# Patient Record
Sex: Female | Born: 1960
Health system: Southern US, Community
[De-identification: ages and names within clinical notes are randomized; demographics above are authoritative.]

## PROBLEM LIST (undated history)

## (undated) DIAGNOSIS — G8929 Other chronic pain: Secondary | ICD-10-CM

## (undated) DIAGNOSIS — M1990 Unspecified osteoarthritis, unspecified site: Secondary | ICD-10-CM

## (undated) DIAGNOSIS — J449 Chronic obstructive pulmonary disease, unspecified: Secondary | ICD-10-CM

## (undated) DIAGNOSIS — G629 Polyneuropathy, unspecified: Secondary | ICD-10-CM

## (undated) HISTORY — PX: ANKLE SURGERY: SHX546

## (undated) HISTORY — PX: TONSILLECTOMY: SUR1361

---

## 2007-11-30 ENCOUNTER — Emergency Department (HOSPITAL_COMMUNITY): Admission: EM | Admit: 2007-11-30 | Discharge: 2007-11-30 | Payer: Self-pay | Admitting: Emergency Medicine

## 2008-02-15 ENCOUNTER — Emergency Department (HOSPITAL_COMMUNITY): Admission: EM | Admit: 2008-02-15 | Discharge: 2008-02-15 | Payer: Self-pay | Admitting: Emergency Medicine

## 2008-03-16 ENCOUNTER — Emergency Department (HOSPITAL_COMMUNITY): Admission: EM | Admit: 2008-03-16 | Discharge: 2008-03-16 | Payer: Self-pay | Admitting: Emergency Medicine

## 2008-03-26 ENCOUNTER — Emergency Department (HOSPITAL_COMMUNITY): Admission: EM | Admit: 2008-03-26 | Discharge: 2008-03-27 | Payer: Self-pay | Admitting: Emergency Medicine

## 2009-03-25 ENCOUNTER — Emergency Department (HOSPITAL_BASED_OUTPATIENT_CLINIC_OR_DEPARTMENT_OTHER): Admission: EM | Admit: 2009-03-25 | Discharge: 2009-03-25 | Payer: Self-pay | Admitting: Emergency Medicine

## 2009-03-25 ENCOUNTER — Ambulatory Visit: Payer: Self-pay | Admitting: Diagnostic Radiology

## 2009-04-23 ENCOUNTER — Emergency Department (HOSPITAL_BASED_OUTPATIENT_CLINIC_OR_DEPARTMENT_OTHER): Admission: EM | Admit: 2009-04-23 | Discharge: 2009-04-23 | Payer: Self-pay | Admitting: Emergency Medicine

## 2009-07-27 ENCOUNTER — Emergency Department (HOSPITAL_BASED_OUTPATIENT_CLINIC_OR_DEPARTMENT_OTHER): Admission: EM | Admit: 2009-07-27 | Discharge: 2009-07-27 | Payer: Self-pay | Admitting: Emergency Medicine

## 2009-11-16 ENCOUNTER — Emergency Department (HOSPITAL_BASED_OUTPATIENT_CLINIC_OR_DEPARTMENT_OTHER): Admission: EM | Admit: 2009-11-16 | Discharge: 2009-11-16 | Payer: Self-pay | Admitting: Emergency Medicine

## 2010-06-07 ENCOUNTER — Emergency Department (HOSPITAL_BASED_OUTPATIENT_CLINIC_OR_DEPARTMENT_OTHER): Admission: EM | Admit: 2010-06-07 | Discharge: 2010-06-07 | Payer: Self-pay | Admitting: Emergency Medicine

## 2011-01-29 LAB — GLUCOSE, CAPILLARY

## 2011-02-17 LAB — GLUCOSE, CAPILLARY: Glucose-Capillary: 157 mg/dL — ABNORMAL HIGH (ref 70–99)

## 2011-04-28 ENCOUNTER — Emergency Department (HOSPITAL_BASED_OUTPATIENT_CLINIC_OR_DEPARTMENT_OTHER)
Admission: EM | Admit: 2011-04-28 | Discharge: 2011-04-28 | Discharge status: Against Medical Advice | Payer: Self-pay | Attending: Emergency Medicine | Admitting: Emergency Medicine

## 2011-04-28 DIAGNOSIS — I2510 Atherosclerotic heart disease of native coronary artery without angina pectoris: Secondary | ICD-10-CM | POA: Insufficient documentation

## 2011-04-28 DIAGNOSIS — E1142 Type 2 diabetes mellitus with diabetic polyneuropathy: Secondary | ICD-10-CM | POA: Insufficient documentation

## 2011-04-28 DIAGNOSIS — Z79899 Other long term (current) drug therapy: Secondary | ICD-10-CM | POA: Insufficient documentation

## 2011-04-28 DIAGNOSIS — F17200 Nicotine dependence, unspecified, uncomplicated: Secondary | ICD-10-CM | POA: Insufficient documentation

## 2011-04-28 DIAGNOSIS — E1149 Type 2 diabetes mellitus with other diabetic neurological complication: Secondary | ICD-10-CM | POA: Insufficient documentation

## 2012-02-08 ENCOUNTER — Encounter (HOSPITAL_BASED_OUTPATIENT_CLINIC_OR_DEPARTMENT_OTHER): Payer: Self-pay | Admitting: *Deleted

## 2012-02-08 ENCOUNTER — Emergency Department (HOSPITAL_BASED_OUTPATIENT_CLINIC_OR_DEPARTMENT_OTHER)
Admission: EM | Admit: 2012-02-08 | Discharge: 2012-02-08 | Disposition: A | Discharge status: Home / Self Care | Payer: Self-pay | Attending: Emergency Medicine | Admitting: Emergency Medicine

## 2012-02-08 DIAGNOSIS — E1140 Type 2 diabetes mellitus with diabetic neuropathy, unspecified: Secondary | ICD-10-CM

## 2012-02-08 DIAGNOSIS — E1142 Type 2 diabetes mellitus with diabetic polyneuropathy: Secondary | ICD-10-CM | POA: Insufficient documentation

## 2012-02-08 DIAGNOSIS — Z794 Long term (current) use of insulin: Secondary | ICD-10-CM | POA: Insufficient documentation

## 2012-02-08 DIAGNOSIS — E1149 Type 2 diabetes mellitus with other diabetic neurological complication: Secondary | ICD-10-CM | POA: Insufficient documentation

## 2012-02-08 DIAGNOSIS — Z880 Allergy status to penicillin: Secondary | ICD-10-CM | POA: Insufficient documentation

## 2012-02-08 DIAGNOSIS — Z888 Allergy status to other drugs, medicaments and biological substances status: Secondary | ICD-10-CM | POA: Insufficient documentation

## 2012-02-08 DIAGNOSIS — F17200 Nicotine dependence, unspecified, uncomplicated: Secondary | ICD-10-CM | POA: Insufficient documentation

## 2012-02-08 HISTORY — DX: Other chronic pain: G89.29

## 2012-02-08 HISTORY — DX: Polyneuropathy, unspecified: G62.9

## 2012-02-08 MED ORDER — TRAMADOL HCL 50 MG PO TABS
50.0000 mg | ORAL_TABLET | Freq: Once | ORAL | Status: AC
  Administered 2012-02-08: 50 mg via ORAL
  Filled 2012-02-08 (×2): qty 1

## 2012-02-08 MED ORDER — TRAMADOL HCL 50 MG PO TABS: 50.0000 mg | ORAL_TABLET | Freq: Four times a day (QID) | ORAL | Status: AC | PRN

## 2012-02-08 NOTE — ED Provider Notes (Signed)
Medical screening examination/treatment/procedure(s) were performed by non-physician practitioner and as supervising physician I was immediately available for consultation/collaboration.   Rolan Bucco, MD 02/08/12 [id number]

## 2012-02-08 NOTE — ED Provider Notes (Signed)
History     CSN: [id number]  Arrival date & time 02/08/12  [place]   First MD Initiated Contact with Patient 02/08/12 1904      Chief Complaint  Patient presents with  . Leg Pain    (Consider location/radiation/quality/duration/timing/severity/associated sxs/prior treatment) HPI Comments: Pt states that she has a history of diabetic neuropathy:pt states that she was treated by Dr. Wende Bushy with Narcotics but she left and pt can't afford to be seen elsewhere:pt states that she is scheduled to see [facility] on April 17th:pt states that she takes neurotin but she needs pain medications on top of it:pt denies weakness:pt c/o burning tingling in bilateral feet  The history is provided by the patient. No language interpreter was used.    Past Medical History  Diagnosis Date  . Diabetes mellitus   . Chronic pain   . Neuropathy     Past Surgical History  Procedure Date  . Tonsillectomy   . Cesarean section   . Ankle surgery     No family history on file.  History  Substance Use Topics  . Smoking status: Current Everyday Smoker -- 0.5 packs/day  . Smokeless tobacco: Not on file  . Alcohol Use: No    OB History    Grav Para Term Preterm Abortions TAB SAB Ect Mult Living                  Review of Systems  Constitutional: Negative.   Respiratory: Negative.   Cardiovascular: Negative.   Musculoskeletal: Negative.   Neurological: Negative for weakness.    Allergies  Naproxen and Penicillins  Home Medications   Current Outpatient Rx  Name Route Sig Dispense Refill  . EZETIMIBE-SIMVASTATIN 10-80 MG PO TABS Oral Take 1 tablet by mouth at bedtime.    Marland Kitchen GABAPENTIN 800 MG PO TABS Oral Take 800 mg by mouth 4 (four) times daily.    . INSULIN DETEMIR 100 UNIT/ML [place] SOLN Subcutaneous Inject 30 Units into the skin every morning.    Marland Kitchen METFORMIN HCL 1000 MG PO TABS Oral Take 1,000 mg by mouth 2 (two) times daily with a meal.    . SERTRALINE HCL 100 MG PO TABS Oral Take 100 mg  by mouth daily.      BP 128/71  Pulse 103  Temp(Src) 98.9 F (37.2 C) (Oral)  Resp 20  SpO2 99%  Physical Exam  Nursing note and vitals reviewed. Constitutional: She is oriented to person, place, and time. She appears well-developed and well-nourished.  HENT:  Head: Normocephalic and atraumatic.  Cardiovascular: Normal rate and regular rhythm.   Pulmonary/Chest: Breath sounds normal.  Musculoskeletal: Normal range of motion.       Pulses intact  Neurological: She is alert and oriented to person, place, and time. She exhibits normal muscle tone. Coordination normal.  Skin: Skin is warm and dry.  Psychiatric: She has a normal mood and affect.    ED Course  Procedures (including critical care time)  Labs Reviewed - No data to display No results found.   1. Diabetic neuropathy       MDM  Discussed with pt that will treat with ultram:pt is upset with this:pt looked up in data base and pt was getting 120 hydrocodone10 per month from [facility]        Teressa Lower, NP 02/08/12 1935

## 2012-02-08 NOTE — ED Notes (Signed)
Pain in her legs. Hx of neuropathy. States she does not have a doctor to give her pain medication. Has an appointment April 17 th to see a doctor.

## 2012-02-08 NOTE — Discharge Instructions (Signed)
Diabetic Neuropathy Diabetic neuropathy is a common complication caused by diabetes. Neuropathy is a term that means nerve disease or damage. If your diabetes is uncontrolled and you have high blood glucose (sugar) levels, over time, this can lead to damage to nerves throughout your body. There are three types of diabetic neuropathy:   Peripheral.   Autonomic.   Focal.  PERIPHERAL NEUROPATHY Peripheral neuropathy is the most common form of diabetic neuropathy. It causes damage to the nerves of the feet and legs and eventually the hands and arms.  SYMPTOMS  Peripheral neuropathy occurs slowly over time. The peripheral nerves sense touch, hot and cold, and pain. When these nerves no longer work:   Your feet become numb.   You can no longer feel pressure or pain in your feet.   You may have burning, stabbing or aching pain.  This can lead to:  Thick calluses over pressure areas.   Pressure sores.   Ulcers. Ulcers can become infected with germs (bacteria) and can even lead to infection in the bones of the feet.  DIAGNOSIS  The diagnosis of diabetic neuropathy is difficult at best. Sensory function testing can be done with:  Light touch using a monofilament.   Vibration with tuning fork.   Sharp sensation with pin prick  Other tests that can help diagnose neuropathy are:  Nerve Conduction Velocities (NCV). This checks the transmission of electrical current through a nerve.   Electromyography (EMG). This shows how muscles respond to electrical signals transmitted by nearby nerves.   Quantitative sensory testing, which is used to assess how your nerves respond to vibration and changes in temperature.  AUTONOMIC NEUROPATHY The autonomic nervous system controls functions that you do not think about. Examples would be:   Heart beat.   Regulation of body temperature.   Blood pressure.   Urination.   Digestion.   Sweating.   Sexual function.  SYMPTOMS  The symptoms of  autonomic neuropathy vary depending on which nerves are affected.   There can be problems with digestion such as:   Feeling sick to your stomach (nausea).   Vomiting.   Bloating.   Constipation.   Diarrhea.   Abdominal pain.   Difficulty with urination may occur because of the inability to sense when your bladder is full. You may have urine leakage (incontinence) or inability to empty your bladder completely (retention).   Palpitations or a feeling of an abnormal heart beat.   Blood pressure drops on arising (orthostatic hypotension). This can happen when you first sit up or stand up. It causes you to feel:   Dizzy.   Weak.   Faint.   Sexual functioning:   In men, inability to attain and maintain an erection.   In women, vaginal dryness and problems with decreased sexual desire and arousal.  DIAGNOSIS  Diagnosis is often based on reported symptoms. Tell your medical caregiver if you experience:   Dizziness.   Constipation.   Diarrhea.   Inappropriate urination or inability to urinate.   Inability to get or maintain an erection.  Tests that may be done include:  An EKG or Holter Monitor. These are tests that can help show problems with the heart rate or heart rhythm.   X-rays can be used to find if there are problems with your ability to properly empty food from your stomach into the small intestine after eating.  FOCAL NEUROPATHY Focal neuropathy affects just one nerve tract and occurs suddenly. However, it usually improves by itself   over time. It does not cause long term damage, and treatments are usually needed only until the problem improves. SYMPTOMS  Examples include:   Abnormal eye movements or abnormal alignment of both eyes.   Weakness in the wrist.   Foot drop, which results in inability to lift the foot properly. This causes abnormal walking or foot movement.  DIAGNOSIS  Diagnosis is made based on your symptoms and what your caregiver finds on  your exam. Other tests that may be done include:  Nerve Conduction Velocities (NCV). This checks the transmission of electrical current through a nerve.   Electromyography (EMG). This shows how muscles respond to electrical signals transmitted by nearby nerves.   Quantitative sensory testing, which is used to assess how your nerves respond to vibration and changes in temperature.  TREATMENT Once nerve damage occurs it cannot be reversed. The goal of treatment is to keep the disease from getting worse. If it gets worse, it will affect more nerve fibers. Controlling your blood (sugar) is the key. You will need to keep your blood glucose and A1c at the target range prescribed by your caregiver. Things that will help control blood glucose levels include:  Blood glucose monitoring.   Meal planning.   Physical activity.   Diabetes medication.  Over time, maintaining lower blood glucose levels helps lessen symptoms. Sometimes, prescription pain medicine is needed. Focal neuropathy can be painful and unpredictable and occurs most often in older adults with diabetes.  SEEK MEDICAL CARE IF:   You develop peripheral nerve symptoms such as burning, numbness, or pain in your feet, legs or hands.   You develop autonomic nerve symptoms such as:   Dizziness.   Abnormal urinary control.   Inability to get an erection.   You develop focal nerve symptoms such as sudden abnormal eye movements or sudden foot drop.  Document Released: 01/08/2002 Document Revised: 10/19/2011 Document Reviewed: 04/09/2009 [employer] Patient Information 2012 [employer]. 

## 2014-11-03 ENCOUNTER — Ambulatory Visit: Payer: Medicare Other | Admitting: *Deleted

## 2014-11-27 DIAGNOSIS — M545 Low back pain: Secondary | ICD-10-CM | POA: Diagnosis not present

## 2014-12-04 DIAGNOSIS — M79604 Pain in right leg: Secondary | ICD-10-CM | POA: Diagnosis not present

## 2014-12-04 DIAGNOSIS — M542 Cervicalgia: Secondary | ICD-10-CM | POA: Diagnosis not present

## 2014-12-04 DIAGNOSIS — M79605 Pain in left leg: Secondary | ICD-10-CM | POA: Diagnosis not present

## 2014-12-04 DIAGNOSIS — G8929 Other chronic pain: Secondary | ICD-10-CM | POA: Diagnosis not present

## 2014-12-04 DIAGNOSIS — M79652 Pain in left thigh: Secondary | ICD-10-CM | POA: Diagnosis not present

## 2014-12-22 DIAGNOSIS — I10 Essential (primary) hypertension: Secondary | ICD-10-CM | POA: Diagnosis not present

## 2014-12-22 DIAGNOSIS — Z0000 Encounter for general adult medical examination without abnormal findings: Secondary | ICD-10-CM | POA: Diagnosis not present

## 2014-12-22 DIAGNOSIS — J40 Bronchitis, not specified as acute or chronic: Secondary | ICD-10-CM | POA: Diagnosis not present

## 2014-12-22 DIAGNOSIS — Z136 Encounter for screening for cardiovascular disorders: Secondary | ICD-10-CM | POA: Diagnosis not present

## 2014-12-22 DIAGNOSIS — E1140 Type 2 diabetes mellitus with diabetic neuropathy, unspecified: Secondary | ICD-10-CM | POA: Diagnosis not present

## 2014-12-22 DIAGNOSIS — Z1389 Encounter for screening for other disorder: Secondary | ICD-10-CM | POA: Diagnosis not present

## 2014-12-22 DIAGNOSIS — Z7689 Persons encountering health services in other specified circumstances: Secondary | ICD-10-CM | POA: Diagnosis not present

## 2015-01-07 DIAGNOSIS — M25551 Pain in right hip: Secondary | ICD-10-CM | POA: Diagnosis not present

## 2015-01-07 DIAGNOSIS — M545 Low back pain: Secondary | ICD-10-CM | POA: Diagnosis not present

## 2015-01-07 DIAGNOSIS — M25552 Pain in left hip: Secondary | ICD-10-CM | POA: Diagnosis not present

## 2015-01-07 DIAGNOSIS — F1120 Opioid dependence, uncomplicated: Secondary | ICD-10-CM | POA: Diagnosis not present

## 2015-01-07 DIAGNOSIS — R202 Paresthesia of skin: Secondary | ICD-10-CM | POA: Diagnosis not present

## 2015-02-01 DIAGNOSIS — Z1231 Encounter for screening mammogram for malignant neoplasm of breast: Secondary | ICD-10-CM | POA: Diagnosis not present

## 2015-02-11 DIAGNOSIS — F1120 Opioid dependence, uncomplicated: Secondary | ICD-10-CM | POA: Diagnosis not present

## 2015-02-11 DIAGNOSIS — G8929 Other chronic pain: Secondary | ICD-10-CM | POA: Diagnosis not present

## 2015-02-11 DIAGNOSIS — M542 Cervicalgia: Secondary | ICD-10-CM | POA: Diagnosis not present

## 2015-02-11 DIAGNOSIS — M545 Low back pain: Secondary | ICD-10-CM | POA: Diagnosis not present

## 2015-03-11 DIAGNOSIS — M25551 Pain in right hip: Secondary | ICD-10-CM | POA: Diagnosis not present

## 2015-03-11 DIAGNOSIS — F1120 Opioid dependence, uncomplicated: Secondary | ICD-10-CM | POA: Diagnosis not present

## 2015-03-11 DIAGNOSIS — M25552 Pain in left hip: Secondary | ICD-10-CM | POA: Diagnosis not present

## 2015-03-11 DIAGNOSIS — M545 Low back pain: Secondary | ICD-10-CM | POA: Diagnosis not present

## 2015-03-11 DIAGNOSIS — G8929 Other chronic pain: Secondary | ICD-10-CM | POA: Diagnosis not present

## 2015-04-02 DIAGNOSIS — R0600 Dyspnea, unspecified: Secondary | ICD-10-CM | POA: Diagnosis not present

## 2015-04-15 DIAGNOSIS — M545 Low back pain: Secondary | ICD-10-CM | POA: Diagnosis not present

## 2015-04-15 DIAGNOSIS — G8929 Other chronic pain: Secondary | ICD-10-CM | POA: Diagnosis not present

## 2015-04-15 DIAGNOSIS — Z79891 Long term (current) use of opiate analgesic: Secondary | ICD-10-CM | POA: Diagnosis not present

## 2015-04-15 DIAGNOSIS — F1120 Opioid dependence, uncomplicated: Secondary | ICD-10-CM | POA: Diagnosis not present

## 2015-04-15 DIAGNOSIS — M542 Cervicalgia: Secondary | ICD-10-CM | POA: Diagnosis not present

## 2015-05-14 DIAGNOSIS — G603 Idiopathic progressive neuropathy: Secondary | ICD-10-CM | POA: Diagnosis not present

## 2015-05-14 DIAGNOSIS — F1120 Opioid dependence, uncomplicated: Secondary | ICD-10-CM | POA: Diagnosis not present

## 2015-05-14 DIAGNOSIS — M542 Cervicalgia: Secondary | ICD-10-CM | POA: Diagnosis not present

## 2015-05-14 DIAGNOSIS — M545 Low back pain: Secondary | ICD-10-CM | POA: Diagnosis not present

## 2015-05-14 DIAGNOSIS — G541 Lumbosacral plexus disorders: Secondary | ICD-10-CM | POA: Diagnosis not present

## 2015-05-14 DIAGNOSIS — G890 Central pain syndrome: Secondary | ICD-10-CM | POA: Diagnosis not present

## 2015-06-01 DIAGNOSIS — E559 Vitamin D deficiency, unspecified: Secondary | ICD-10-CM | POA: Diagnosis not present

## 2015-06-01 DIAGNOSIS — J449 Chronic obstructive pulmonary disease, unspecified: Secondary | ICD-10-CM | POA: Diagnosis not present

## 2015-06-01 DIAGNOSIS — E1140 Type 2 diabetes mellitus with diabetic neuropathy, unspecified: Secondary | ICD-10-CM | POA: Diagnosis not present

## 2015-06-01 DIAGNOSIS — I10 Essential (primary) hypertension: Secondary | ICD-10-CM | POA: Diagnosis not present

## 2015-06-01 DIAGNOSIS — F17210 Nicotine dependence, cigarettes, uncomplicated: Secondary | ICD-10-CM | POA: Diagnosis not present

## 2015-06-01 DIAGNOSIS — E785 Hyperlipidemia, unspecified: Secondary | ICD-10-CM | POA: Diagnosis not present

## 2015-06-14 DIAGNOSIS — M79662 Pain in left lower leg: Secondary | ICD-10-CM | POA: Diagnosis not present

## 2015-06-14 DIAGNOSIS — G8929 Other chronic pain: Secondary | ICD-10-CM | POA: Diagnosis not present

## 2015-06-14 DIAGNOSIS — M545 Low back pain: Secondary | ICD-10-CM | POA: Diagnosis not present

## 2015-06-14 DIAGNOSIS — M79661 Pain in right lower leg: Secondary | ICD-10-CM | POA: Diagnosis not present

## 2015-06-14 DIAGNOSIS — F1120 Opioid dependence, uncomplicated: Secondary | ICD-10-CM | POA: Diagnosis not present

## 2015-07-21 DIAGNOSIS — M545 Low back pain: Secondary | ICD-10-CM | POA: Diagnosis not present

## 2015-07-21 DIAGNOSIS — G8929 Other chronic pain: Secondary | ICD-10-CM | POA: Diagnosis not present

## 2015-07-21 DIAGNOSIS — F1120 Opioid dependence, uncomplicated: Secondary | ICD-10-CM | POA: Diagnosis not present

## 2015-08-04 DIAGNOSIS — G8921 Chronic pain due to trauma: Secondary | ICD-10-CM | POA: Diagnosis not present

## 2015-08-04 DIAGNOSIS — M5400 Panniculitis affecting regions of neck and back, site unspecified: Secondary | ICD-10-CM | POA: Diagnosis not present

## 2015-08-04 DIAGNOSIS — F1120 Opioid dependence, uncomplicated: Secondary | ICD-10-CM | POA: Diagnosis not present

## 2015-08-04 DIAGNOSIS — M1731 Unilateral post-traumatic osteoarthritis, right knee: Secondary | ICD-10-CM | POA: Diagnosis not present

## 2015-08-06 DIAGNOSIS — Z79899 Other long term (current) drug therapy: Secondary | ICD-10-CM | POA: Diagnosis not present

## 2015-08-06 DIAGNOSIS — G890 Central pain syndrome: Secondary | ICD-10-CM | POA: Diagnosis not present

## 2015-08-06 DIAGNOSIS — R202 Paresthesia of skin: Secondary | ICD-10-CM | POA: Diagnosis not present

## 2015-08-06 DIAGNOSIS — M545 Low back pain: Secondary | ICD-10-CM | POA: Diagnosis not present

## 2015-08-20 DIAGNOSIS — G890 Central pain syndrome: Secondary | ICD-10-CM | POA: Diagnosis not present

## 2015-08-20 DIAGNOSIS — Z79899 Other long term (current) drug therapy: Secondary | ICD-10-CM | POA: Diagnosis not present

## 2015-08-20 DIAGNOSIS — M545 Low back pain: Secondary | ICD-10-CM | POA: Diagnosis not present

## 2015-08-20 DIAGNOSIS — R202 Paresthesia of skin: Secondary | ICD-10-CM | POA: Diagnosis not present

## 2015-08-20 DIAGNOSIS — M5441 Lumbago with sciatica, right side: Secondary | ICD-10-CM | POA: Diagnosis not present

## 2015-08-20 DIAGNOSIS — M25561 Pain in right knee: Secondary | ICD-10-CM | POA: Diagnosis not present

## 2015-09-03 DIAGNOSIS — Z0100 Encounter for examination of eyes and vision without abnormal findings: Secondary | ICD-10-CM | POA: Diagnosis not present

## 2015-09-03 DIAGNOSIS — I10 Essential (primary) hypertension: Secondary | ICD-10-CM | POA: Diagnosis not present

## 2015-09-03 DIAGNOSIS — E1140 Type 2 diabetes mellitus with diabetic neuropathy, unspecified: Secondary | ICD-10-CM | POA: Diagnosis not present

## 2015-09-03 DIAGNOSIS — Z23 Encounter for immunization: Secondary | ICD-10-CM | POA: Diagnosis not present

## 2015-09-03 DIAGNOSIS — Z01118 Encounter for examination of ears and hearing with other abnormal findings: Secondary | ICD-10-CM | POA: Diagnosis not present

## 2015-09-03 DIAGNOSIS — Z0000 Encounter for general adult medical examination without abnormal findings: Secondary | ICD-10-CM | POA: Diagnosis not present

## 2015-09-22 DIAGNOSIS — M545 Low back pain: Secondary | ICD-10-CM | POA: Diagnosis not present

## 2015-09-22 DIAGNOSIS — M25551 Pain in right hip: Secondary | ICD-10-CM | POA: Diagnosis not present

## 2015-09-22 DIAGNOSIS — F1120 Opioid dependence, uncomplicated: Secondary | ICD-10-CM | POA: Diagnosis not present

## 2015-09-22 DIAGNOSIS — Z79891 Long term (current) use of opiate analgesic: Secondary | ICD-10-CM | POA: Diagnosis not present

## 2015-09-22 DIAGNOSIS — G8929 Other chronic pain: Secondary | ICD-10-CM | POA: Diagnosis not present

## 2015-09-22 DIAGNOSIS — M79652 Pain in left thigh: Secondary | ICD-10-CM | POA: Diagnosis not present

## 2015-10-21 DIAGNOSIS — E1140 Type 2 diabetes mellitus with diabetic neuropathy, unspecified: Secondary | ICD-10-CM | POA: Diagnosis not present

## 2015-10-21 DIAGNOSIS — R509 Fever, unspecified: Secondary | ICD-10-CM | POA: Diagnosis not present

## 2015-10-21 DIAGNOSIS — E785 Hyperlipidemia, unspecified: Secondary | ICD-10-CM | POA: Diagnosis not present

## 2015-10-21 DIAGNOSIS — R05 Cough: Secondary | ICD-10-CM | POA: Diagnosis not present

## 2015-10-21 DIAGNOSIS — I10 Essential (primary) hypertension: Secondary | ICD-10-CM | POA: Diagnosis not present

## 2015-10-27 DIAGNOSIS — Z87891 Personal history of nicotine dependence: Secondary | ICD-10-CM | POA: Diagnosis not present

## 2015-10-27 DIAGNOSIS — M79609 Pain in unspecified limb: Secondary | ICD-10-CM | POA: Diagnosis not present

## 2015-12-02 DIAGNOSIS — R0602 Shortness of breath: Secondary | ICD-10-CM | POA: Diagnosis not present

## 2015-12-08 DIAGNOSIS — R0609 Other forms of dyspnea: Secondary | ICD-10-CM | POA: Diagnosis not present

## 2015-12-08 DIAGNOSIS — R9439 Abnormal result of other cardiovascular function study: Secondary | ICD-10-CM | POA: Diagnosis not present

## 2015-12-09 DIAGNOSIS — Z452 Encounter for adjustment and management of vascular access device: Secondary | ICD-10-CM | POA: Diagnosis not present

## 2015-12-09 DIAGNOSIS — R9439 Abnormal result of other cardiovascular function study: Secondary | ICD-10-CM | POA: Diagnosis not present

## 2015-12-09 DIAGNOSIS — R0609 Other forms of dyspnea: Secondary | ICD-10-CM | POA: Diagnosis not present

## 2015-12-13 DIAGNOSIS — F17210 Nicotine dependence, cigarettes, uncomplicated: Secondary | ICD-10-CM | POA: Diagnosis not present

## 2015-12-13 DIAGNOSIS — E1142 Type 2 diabetes mellitus with diabetic polyneuropathy: Secondary | ICD-10-CM | POA: Diagnosis not present

## 2015-12-13 DIAGNOSIS — R0609 Other forms of dyspnea: Secondary | ICD-10-CM | POA: Diagnosis not present

## 2015-12-13 DIAGNOSIS — I2510 Atherosclerotic heart disease of native coronary artery without angina pectoris: Secondary | ICD-10-CM | POA: Diagnosis not present

## 2015-12-13 DIAGNOSIS — E785 Hyperlipidemia, unspecified: Secondary | ICD-10-CM | POA: Diagnosis not present

## 2015-12-13 DIAGNOSIS — R9430 Abnormal result of cardiovascular function study, unspecified: Secondary | ICD-10-CM | POA: Diagnosis not present

## 2015-12-22 DIAGNOSIS — F418 Other specified anxiety disorders: Secondary | ICD-10-CM | POA: Diagnosis not present

## 2015-12-22 DIAGNOSIS — E1140 Type 2 diabetes mellitus with diabetic neuropathy, unspecified: Secondary | ICD-10-CM | POA: Diagnosis not present

## 2015-12-22 DIAGNOSIS — E785 Hyperlipidemia, unspecified: Secondary | ICD-10-CM | POA: Diagnosis not present

## 2015-12-22 DIAGNOSIS — J449 Chronic obstructive pulmonary disease, unspecified: Secondary | ICD-10-CM | POA: Diagnosis not present

## 2015-12-22 DIAGNOSIS — I10 Essential (primary) hypertension: Secondary | ICD-10-CM | POA: Diagnosis not present

## 2016-01-18 DIAGNOSIS — Z0000 Encounter for general adult medical examination without abnormal findings: Secondary | ICD-10-CM | POA: Diagnosis not present

## 2016-01-18 DIAGNOSIS — E1140 Type 2 diabetes mellitus with diabetic neuropathy, unspecified: Secondary | ICD-10-CM | POA: Diagnosis not present

## 2016-01-18 DIAGNOSIS — Z0100 Encounter for examination of eyes and vision without abnormal findings: Secondary | ICD-10-CM | POA: Diagnosis not present

## 2016-01-18 DIAGNOSIS — Z01118 Encounter for examination of ears and hearing with other abnormal findings: Secondary | ICD-10-CM | POA: Diagnosis not present

## 2016-01-18 DIAGNOSIS — Z131 Encounter for screening for diabetes mellitus: Secondary | ICD-10-CM | POA: Diagnosis not present

## 2016-01-18 DIAGNOSIS — I10 Essential (primary) hypertension: Secondary | ICD-10-CM | POA: Diagnosis not present

## 2016-01-18 DIAGNOSIS — F418 Other specified anxiety disorders: Secondary | ICD-10-CM | POA: Diagnosis not present

## 2016-01-18 DIAGNOSIS — Z1389 Encounter for screening for other disorder: Secondary | ICD-10-CM | POA: Diagnosis not present

## 2016-01-18 DIAGNOSIS — Z7689 Persons encountering health services in other specified circumstances: Secondary | ICD-10-CM | POA: Diagnosis not present

## 2016-01-24 DIAGNOSIS — E782 Mixed hyperlipidemia: Secondary | ICD-10-CM | POA: Diagnosis not present

## 2016-01-24 DIAGNOSIS — E1165 Type 2 diabetes mellitus with hyperglycemia: Secondary | ICD-10-CM | POA: Diagnosis not present

## 2016-01-24 DIAGNOSIS — J449 Chronic obstructive pulmonary disease, unspecified: Secondary | ICD-10-CM | POA: Diagnosis not present

## 2016-01-24 DIAGNOSIS — I2510 Atherosclerotic heart disease of native coronary artery without angina pectoris: Secondary | ICD-10-CM | POA: Diagnosis not present

## 2016-02-14 DIAGNOSIS — E785 Hyperlipidemia, unspecified: Secondary | ICD-10-CM | POA: Diagnosis not present

## 2016-02-14 DIAGNOSIS — I10 Essential (primary) hypertension: Secondary | ICD-10-CM | POA: Diagnosis not present

## 2016-02-14 DIAGNOSIS — E1140 Type 2 diabetes mellitus with diabetic neuropathy, unspecified: Secondary | ICD-10-CM | POA: Diagnosis not present

## 2016-02-14 DIAGNOSIS — J209 Acute bronchitis, unspecified: Secondary | ICD-10-CM | POA: Diagnosis not present

## 2016-02-14 DIAGNOSIS — J449 Chronic obstructive pulmonary disease, unspecified: Secondary | ICD-10-CM | POA: Diagnosis not present

## 2016-03-08 DIAGNOSIS — G894 Chronic pain syndrome: Secondary | ICD-10-CM | POA: Diagnosis not present

## 2016-03-22 DIAGNOSIS — M545 Low back pain: Secondary | ICD-10-CM | POA: Diagnosis not present

## 2016-03-22 DIAGNOSIS — G894 Chronic pain syndrome: Secondary | ICD-10-CM | POA: Diagnosis not present

## 2016-04-18 DIAGNOSIS — F418 Other specified anxiety disorders: Secondary | ICD-10-CM | POA: Diagnosis not present

## 2016-04-18 DIAGNOSIS — I10 Essential (primary) hypertension: Secondary | ICD-10-CM | POA: Diagnosis not present

## 2016-04-18 DIAGNOSIS — Z5181 Encounter for therapeutic drug level monitoring: Secondary | ICD-10-CM | POA: Diagnosis not present

## 2016-04-18 DIAGNOSIS — Z7689 Persons encountering health services in other specified circumstances: Secondary | ICD-10-CM | POA: Diagnosis not present

## 2016-04-18 DIAGNOSIS — J449 Chronic obstructive pulmonary disease, unspecified: Secondary | ICD-10-CM | POA: Diagnosis not present

## 2016-04-18 DIAGNOSIS — E785 Hyperlipidemia, unspecified: Secondary | ICD-10-CM | POA: Diagnosis not present

## 2016-04-18 DIAGNOSIS — E1140 Type 2 diabetes mellitus with diabetic neuropathy, unspecified: Secondary | ICD-10-CM | POA: Diagnosis not present

## 2016-04-19 DIAGNOSIS — G894 Chronic pain syndrome: Secondary | ICD-10-CM | POA: Diagnosis not present

## 2016-04-19 DIAGNOSIS — M545 Low back pain: Secondary | ICD-10-CM | POA: Diagnosis not present

## 2016-05-22 DIAGNOSIS — M545 Low back pain: Secondary | ICD-10-CM | POA: Diagnosis not present

## 2016-05-22 DIAGNOSIS — G894 Chronic pain syndrome: Secondary | ICD-10-CM | POA: Diagnosis not present

## 2016-05-23 DIAGNOSIS — R509 Fever, unspecified: Secondary | ICD-10-CM | POA: Diagnosis not present

## 2016-05-23 DIAGNOSIS — E1140 Type 2 diabetes mellitus with diabetic neuropathy, unspecified: Secondary | ICD-10-CM | POA: Diagnosis not present

## 2016-05-23 DIAGNOSIS — R05 Cough: Secondary | ICD-10-CM | POA: Diagnosis not present

## 2016-05-23 DIAGNOSIS — G894 Chronic pain syndrome: Secondary | ICD-10-CM | POA: Diagnosis not present

## 2016-05-23 DIAGNOSIS — I10 Essential (primary) hypertension: Secondary | ICD-10-CM | POA: Diagnosis not present

## 2016-05-23 DIAGNOSIS — J449 Chronic obstructive pulmonary disease, unspecified: Secondary | ICD-10-CM | POA: Diagnosis not present

## 2016-06-21 DIAGNOSIS — M545 Low back pain: Secondary | ICD-10-CM | POA: Diagnosis not present

## 2016-06-21 DIAGNOSIS — G894 Chronic pain syndrome: Secondary | ICD-10-CM | POA: Diagnosis not present

## 2016-07-03 DIAGNOSIS — E785 Hyperlipidemia, unspecified: Secondary | ICD-10-CM | POA: Diagnosis not present

## 2016-07-03 DIAGNOSIS — J449 Chronic obstructive pulmonary disease, unspecified: Secondary | ICD-10-CM | POA: Diagnosis not present

## 2016-07-03 DIAGNOSIS — I10 Essential (primary) hypertension: Secondary | ICD-10-CM | POA: Diagnosis not present

## 2016-07-03 DIAGNOSIS — R05 Cough: Secondary | ICD-10-CM | POA: Diagnosis not present

## 2016-07-03 DIAGNOSIS — E1140 Type 2 diabetes mellitus with diabetic neuropathy, unspecified: Secondary | ICD-10-CM | POA: Diagnosis not present

## 2016-07-18 DIAGNOSIS — G894 Chronic pain syndrome: Secondary | ICD-10-CM | POA: Diagnosis not present

## 2016-07-18 DIAGNOSIS — M545 Low back pain: Secondary | ICD-10-CM | POA: Diagnosis not present

## 2016-08-14 DIAGNOSIS — M545 Low back pain: Secondary | ICD-10-CM | POA: Diagnosis not present

## 2016-08-14 DIAGNOSIS — G894 Chronic pain syndrome: Secondary | ICD-10-CM | POA: Diagnosis not present

## 2016-09-11 DIAGNOSIS — G894 Chronic pain syndrome: Secondary | ICD-10-CM | POA: Diagnosis not present

## 2016-09-11 DIAGNOSIS — M545 Low back pain: Secondary | ICD-10-CM | POA: Diagnosis not present

## 2016-10-09 DIAGNOSIS — M545 Low back pain: Secondary | ICD-10-CM | POA: Diagnosis not present

## 2016-10-09 DIAGNOSIS — G894 Chronic pain syndrome: Secondary | ICD-10-CM | POA: Diagnosis not present

## 2016-11-08 DIAGNOSIS — G894 Chronic pain syndrome: Secondary | ICD-10-CM | POA: Diagnosis not present

## 2016-11-08 DIAGNOSIS — M545 Low back pain: Secondary | ICD-10-CM | POA: Diagnosis not present

## 2016-12-11 DIAGNOSIS — M545 Low back pain: Secondary | ICD-10-CM | POA: Diagnosis not present

## 2016-12-11 DIAGNOSIS — G894 Chronic pain syndrome: Secondary | ICD-10-CM | POA: Diagnosis not present

## 2016-12-21 DIAGNOSIS — E785 Hyperlipidemia, unspecified: Secondary | ICD-10-CM | POA: Diagnosis not present

## 2016-12-21 DIAGNOSIS — E1140 Type 2 diabetes mellitus with diabetic neuropathy, unspecified: Secondary | ICD-10-CM | POA: Diagnosis not present

## 2016-12-21 DIAGNOSIS — J449 Chronic obstructive pulmonary disease, unspecified: Secondary | ICD-10-CM | POA: Diagnosis not present

## 2016-12-21 DIAGNOSIS — I10 Essential (primary) hypertension: Secondary | ICD-10-CM | POA: Diagnosis not present

## 2017-01-06 ENCOUNTER — Emergency Department (HOSPITAL_BASED_OUTPATIENT_CLINIC_OR_DEPARTMENT_OTHER)
Admission: EM | Admit: 2017-01-06 | Discharge: 2017-01-06 | Payer: Medicare Other | Attending: Emergency Medicine | Admitting: Emergency Medicine

## 2017-01-06 ENCOUNTER — Encounter (HOSPITAL_BASED_OUTPATIENT_CLINIC_OR_DEPARTMENT_OTHER): Payer: Self-pay | Admitting: Emergency Medicine

## 2017-01-06 ENCOUNTER — Emergency Department (HOSPITAL_BASED_OUTPATIENT_CLINIC_OR_DEPARTMENT_OTHER): Payer: Medicare Other

## 2017-01-06 DIAGNOSIS — F17200 Nicotine dependence, unspecified, uncomplicated: Secondary | ICD-10-CM | POA: Diagnosis not present

## 2017-01-06 DIAGNOSIS — R0981 Nasal congestion: Secondary | ICD-10-CM | POA: Diagnosis not present

## 2017-01-06 DIAGNOSIS — R05 Cough: Secondary | ICD-10-CM | POA: Diagnosis not present

## 2017-01-06 DIAGNOSIS — Z79899 Other long term (current) drug therapy: Secondary | ICD-10-CM | POA: Insufficient documentation

## 2017-01-06 DIAGNOSIS — J3489 Other specified disorders of nose and nasal sinuses: Secondary | ICD-10-CM | POA: Insufficient documentation

## 2017-01-06 DIAGNOSIS — J111 Influenza due to unidentified influenza virus with other respiratory manifestations: Secondary | ICD-10-CM

## 2017-01-06 DIAGNOSIS — R69 Illness, unspecified: Secondary | ICD-10-CM

## 2017-01-06 DIAGNOSIS — R509 Fever, unspecified: Secondary | ICD-10-CM | POA: Diagnosis present

## 2017-01-06 DIAGNOSIS — R0602 Shortness of breath: Secondary | ICD-10-CM | POA: Diagnosis not present

## 2017-01-06 DIAGNOSIS — J449 Chronic obstructive pulmonary disease, unspecified: Secondary | ICD-10-CM | POA: Insufficient documentation

## 2017-01-06 DIAGNOSIS — R062 Wheezing: Secondary | ICD-10-CM | POA: Diagnosis not present

## 2017-01-06 DIAGNOSIS — E119 Type 2 diabetes mellitus without complications: Secondary | ICD-10-CM | POA: Insufficient documentation

## 2017-01-06 DIAGNOSIS — A419 Sepsis, unspecified organism: Secondary | ICD-10-CM | POA: Diagnosis not present

## 2017-01-06 DIAGNOSIS — Z7984 Long term (current) use of oral hypoglycemic drugs: Secondary | ICD-10-CM | POA: Insufficient documentation

## 2017-01-06 HISTORY — DX: Chronic obstructive pulmonary disease, unspecified: J44.9

## 2017-01-06 HISTORY — DX: Unspecified osteoarthritis, unspecified site: M19.90

## 2017-01-06 LAB — COMPREHENSIVE METABOLIC PANEL
ALBUMIN: 3.6 g/dL (ref 3.5–5.0)
ALK PHOS: 47 U/L (ref 38–126)
ALT: 6 U/L — AB (ref 14–54)
AST: 13 U/L — AB (ref 15–41)
Anion gap: 9 (ref 5–15)
BILIRUBIN TOTAL: 0.5 mg/dL (ref 0.3–1.2)
BUN: 15 mg/dL (ref 6–20)
CALCIUM: 8.1 mg/dL — AB (ref 8.9–10.3)
CO2: 25 mmol/L (ref 22–32)
CREATININE: 1.39 mg/dL — AB (ref 0.44–1.00)
Chloride: 98 mmol/L — ABNORMAL LOW (ref 101–111)
GFR calc Af Amer: 48 mL/min — ABNORMAL LOW (ref >60)
GFR, EST NON AFRICAN AMERICAN: 41 mL/min — AB (ref >60)
GLUCOSE: 134 mg/dL — AB (ref 65–99)
POTASSIUM: 3.5 mmol/L (ref 3.5–5.1)
Sodium: 132 mmol/L — ABNORMAL LOW (ref 135–145)
TOTAL PROTEIN: 6.5 g/dL (ref 6.5–8.1)

## 2017-01-06 LAB — I-STAT CG4 LACTIC ACID, ED
LACTIC ACID, VENOUS: 2.37 mmol/L — AB (ref 0.5–1.9)
LACTIC ACID, VENOUS: 1.61 mmol/L (ref 0.5–1.9)

## 2017-01-06 LAB — CBC WITH DIFFERENTIAL/PLATELET
BASOS ABS: 0 10*3/uL (ref 0.0–0.1)
Basophils Relative: 0 %
EOS ABS: 0.1 10*3/uL (ref 0.0–0.7)
EOS PCT: 1 %
HCT: 27.4 % — ABNORMAL LOW (ref 36.0–46.0)
Hemoglobin: 8.8 g/dL — ABNORMAL LOW (ref 12.0–15.0)
LYMPHS PCT: 8 %
Lymphs Abs: 0.6 10*3/uL — ABNORMAL LOW (ref 0.7–4.0)
MCH: 33.6 pg (ref 26.0–34.0)
MCHC: 32.1 g/dL (ref 30.0–36.0)
MCV: 104.6 fL — AB (ref 78.0–100.0)
MONO ABS: 0.4 10*3/uL (ref 0.1–1.0)
Monocytes Relative: 6 %
Neutro Abs: 5.8 10*3/uL (ref 1.7–7.7)
Neutrophils Relative %: 85 %
PLATELETS: 207 10*3/uL (ref 150–400)
RBC: 2.62 MIL/uL — AB (ref 3.87–5.11)
RDW: 13 % (ref 11.5–15.5)
WBC: 6.9 10*3/uL (ref 4.0–10.5)

## 2017-01-06 LAB — URINALYSIS, ROUTINE W REFLEX MICROSCOPIC
Bilirubin Urine: NEGATIVE
GLUCOSE, UA: NEGATIVE mg/dL
KETONES UR: NEGATIVE mg/dL
LEUKOCYTES UA: NEGATIVE
Nitrite: NEGATIVE
PROTEIN: NEGATIVE mg/dL
Specific Gravity, Urine: 1.004 — ABNORMAL LOW (ref 1.005–1.030)
pH: 5.5 (ref 5.0–8.0)

## 2017-01-06 LAB — URINALYSIS, MICROSCOPIC (REFLEX): WBC, UA: NONE SEEN WBC/hpf (ref 0–5)

## 2017-01-06 LAB — CBG MONITORING, ED: Glucose-Capillary: 117 mg/dL — ABNORMAL HIGH (ref 65–99)

## 2017-01-06 MED ORDER — VANCOMYCIN HCL IN DEXTROSE 1-5 GM/200ML-% IV SOLN
1000.0000 mg | Freq: Once | INTRAVENOUS | Status: AC
  Administered 2017-01-06: 1000 mg via INTRAVENOUS
  Filled 2017-01-06: qty 200

## 2017-01-06 MED ORDER — SODIUM CHLORIDE 0.9 % IV BOLUS (SEPSIS)
1000.0000 mL | Freq: Once | INTRAVENOUS | Status: AC
  Administered 2017-01-06: 1000 mL via INTRAVENOUS

## 2017-01-06 MED ORDER — SODIUM CHLORIDE 0.9 % IV BOLUS (SEPSIS)
250.0000 mL | Freq: Once | INTRAVENOUS | Status: AC
  Administered 2017-01-06: 250 mL via INTRAVENOUS

## 2017-01-06 MED ORDER — ALBUTEROL SULFATE (2.5 MG/3ML) 0.083% IN NEBU
5.0000 mg | INHALATION_SOLUTION | Freq: Once | RESPIRATORY_TRACT | Status: AC
  Administered 2017-01-06: 5 mg via RESPIRATORY_TRACT
  Filled 2017-01-06: qty 6

## 2017-01-06 MED ORDER — ACETAMINOPHEN 325 MG PO TABS
650.0000 mg | ORAL_TABLET | Freq: Once | ORAL | Status: AC
  Administered 2017-01-06: 650 mg via ORAL
  Filled 2017-01-06: qty 2

## 2017-01-06 MED ORDER — ACETAMINOPHEN 325 MG PO TABS
ORAL_TABLET | ORAL | Status: AC
  Filled 2017-01-06: qty 2

## 2017-01-06 MED ORDER — IPRATROPIUM-ALBUTEROL 0.5-2.5 (3) MG/3ML IN SOLN
3.0000 mL | Freq: Four times a day (QID) | RESPIRATORY_TRACT | Status: DC
  Administered 2017-01-06: 3 mL via RESPIRATORY_TRACT
  Filled 2017-01-06: qty 3

## 2017-01-06 MED ORDER — DEXTROSE 5 % IV SOLN
1.0000 g | Freq: Once | INTRAVENOUS | Status: AC
  Administered 2017-01-06: 1 g via INTRAVENOUS

## 2017-01-06 MED ORDER — BENZONATATE 100 MG PO CAPS: 100.0000 mg | ORAL_CAPSULE | Freq: Three times a day (TID) | ORAL | 0 refills | Status: AC

## 2017-01-06 MED ORDER — VANCOMYCIN HCL IN DEXTROSE 750-5 MG/150ML-% IV SOLN
750.0000 mg | INTRAVENOUS | Status: DC
  Filled 2017-01-06: qty 150

## 2017-01-06 MED ORDER — CEFEPIME HCL 2 G IJ SOLR
INTRAMUSCULAR | Status: AC
  Filled 2017-01-06: qty 2

## 2017-01-06 MED ORDER — SODIUM CHLORIDE 0.9 % IV BOLUS (SEPSIS)
500.0000 mL | Freq: Once | INTRAVENOUS | Status: AC
  Administered 2017-01-06: 500 mL via INTRAVENOUS

## 2017-01-06 MED ORDER — OSELTAMIVIR PHOSPHATE 75 MG PO CAPS
75.0000 mg | ORAL_CAPSULE | Freq: Once | ORAL | Status: AC
  Administered 2017-01-06: 75 mg via ORAL
  Filled 2017-01-06: qty 1

## 2017-01-06 MED ORDER — ACETAMINOPHEN 325 MG PO TABS
650.0000 mg | ORAL_TABLET | Freq: Once | ORAL | Status: AC
  Administered 2017-01-06: 650 mg via ORAL

## 2017-01-06 MED ORDER — OSELTAMIVIR PHOSPHATE 75 MG PO CAPS: 75.0000 mg | ORAL_CAPSULE | Freq: Two times a day (BID) | ORAL | 0 refills | Status: AC

## 2017-01-06 MED ORDER — CEFEPIME HCL 1 G IJ SOLR
INTRAMUSCULAR | Status: DC
Start: 2017-01-06 — End: 2017-01-07
  Filled 2017-01-06: qty 1

## 2017-01-06 NOTE — ED Notes (Signed)
Contacted [facility] hospitalist at Dr. Burr Medico request

## 2017-01-06 NOTE — Discharge Instructions (Signed)
Read the information below.  Your chest x-ray was re-assuring. You may have an influenza like illness. You are being prescribed Tamiflu, please take as directed.  You can use OTC Flonase for relief of nasal symptoms.  I have prescribed tessalon perles for cough relief.  Continue Tylenol as well as Motrin per package instructions for pain relief and fever reduction.  Use inhaler as needed.  Use the prescribed medication as directed.  Please discuss all new medications with your pharmacist.   Follow up with your primary provider within one week for re-evaluation.  You may return to the Emergency Department at any time for worsening condition or any new symptoms that concern you. Return if develop worsening symptoms, chest pain, shortness of breath, loss of consciousness, or any other new/concerning symptoms.

## 2017-01-06 NOTE — ED Notes (Signed)
IV NS infiltrated, swelling at site, IV removed and warm compress applied.  Unknown amount of IV NS infused into vein.

## 2017-01-06 NOTE — ED Notes (Addendum)
Pt resting quietly eyes closed. Sats 87-89%. Pt arouses easily. Brett Canales, RRT made aware. Pt placed on O2 2L/[place]. Pt was incontinent of urine. Placed in paper scrub pants.

## 2017-01-06 NOTE — ED Notes (Signed)
Pt given d/c instructions as per chart. Verbalizes understanding. No questions. Rx x 1 

## 2017-01-06 NOTE — ED Notes (Signed)
IV infiltrated with approximately 30 ml of vancomycin, Pharmacy notified, ICE and elevation applied.

## 2017-01-06 NOTE — ED Notes (Signed)
Leonides Sake, PA of lower bp reading.

## 2017-01-06 NOTE — ED Provider Notes (Signed)
[facility]   CSN: [id number] Arrival date & time: 01/06/17  1154     History   Chief Complaint Chief Complaint  Patient presents with  . Influenza    HPI Amy Montoya is a 56 y.o. female.  Amy Montoya is a 56 y.o. female with h/o T2DM, COPD, arthritis, and chronic pain presents to ED with complaint of influenza like illness. Complains of onset of fever, chills, fatigue, generalized myalgias, nasal congestion, rhinorrhea, post nasal drip, maxillary sinus pressure, mild frontal pressure headache, productive cough, wheezing, shortness of breath, and nausea. Denies ear pain/pressure, sore throat, visual disturbance, chest pain, abdominal pain, vomiting, diarrhea, numbness, weakness, or loss of consciousness. Has taken goody powder and ibuprofen with transient relief of symptoms. Known exposure to flu - grandkids had confirmed influenza last week.       Past Medical History:  Diagnosis Date  . Arthritis   . Chronic pain   . COPD (chronic obstructive pulmonary disease) (HCC)   . Diabetes mellitus   . Neuropathy (HCC)     There are no active problems to display for this patient.   Past Surgical History:  Procedure Laterality Date  . ANKLE SURGERY    . CESAREAN SECTION    . TONSILLECTOMY      OB History    No data available       Home Medications    Prior to Admission medications   Medication Sig Start Date End Date Taking? Authorizing Provider  clonazePAM (KLONOPIN) 1 MG tablet Take 1 mg by mouth 2 (two) times daily as needed for anxiety.   Yes Historical Provider, MD  metFORMIN (GLUCOPHAGE) 1000 MG tablet Take 1,000 mg by mouth 2 (two) times daily with a meal.   Yes Historical Provider, MD  pravastatin (PRAVACHOL) 10 MG tablet Take 10 mg by mouth daily.   Yes Historical Provider, MD  benzonatate (TESSALON) 100 MG capsule Take 1 capsule (100 mg total) by mouth every 8 (eight) hours. 01/06/17   Lona Kettle, PA-C    ezetimibe-simvastatin (VYTORIN) 10-80 MG per tablet Take 1 tablet by mouth at bedtime.    Historical Provider, MD  gabapentin (NEURONTIN) 800 MG tablet Take 800 mg by mouth 4 (four) times daily.    Historical Provider, MD  insulin detemir (LEVEMIR) 100 UNIT/ML injection Inject 30 Units into the skin every morning.    Historical Provider, MD  oseltamivir (TAMIFLU) 75 MG capsule Take 1 capsule (75 mg total) by mouth every 12 (twelve) hours. 01/06/17   Lona Kettle, PA-C  sertraline (ZOLOFT) 100 MG tablet Take 100 mg by mouth daily.    Historical Provider, MD    Family History No family history on file.  Social History Social History  Substance Use Topics  . Smoking status: Current Every Day Smoker    Packs/day: 1.00  . Smokeless tobacco: Never Used  . Alcohol use No     Allergies   Naproxen and Penicillins   Review of Systems Review of Systems  Constitutional: Positive for chills, fatigue and fever.  HENT: Positive for congestion, rhinorrhea and sinus pressure. Negative for ear discharge, ear pain, sore throat and trouble swallowing.   Eyes: Negative for visual disturbance.  Respiratory: Positive for cough, shortness of breath and wheezing.   Cardiovascular: Negative for chest pain.  Gastrointestinal: Positive for nausea. Negative for abdominal pain, diarrhea and vomiting.  Genitourinary: Negative for dysuria and hematuria.  Musculoskeletal: Positive for myalgias.  Skin: Negative for rash.  Neurological: Positive for light-headedness and headaches. Negative for syncope.     Physical Exam Updated Vital Signs BP (!) 97/48   Pulse 93   Temp 98.7 F (37.1 C) (Oral)   Resp 18   Ht 5\' 7"  (1.702 m)   Wt 56.2 kg   SpO2 97%   BMI 19.42 kg/m   Physical Exam  Constitutional: She appears well-developed and well-nourished. No distress.  HENT:  Head: Normocephalic and atraumatic.  Right Ear: Tympanic membrane, external ear and ear canal normal.  Left Ear: Tympanic  membrane, external ear and ear canal normal.  Nose: Right sinus exhibits maxillary sinus tenderness. Right sinus exhibits no frontal sinus tenderness. Left sinus exhibits maxillary sinus tenderness. Left sinus exhibits no frontal sinus tenderness.  Mouth/Throat: Uvula is midline. Mucous membranes are dry. No trismus in the jaw. No oropharyngeal exudate, posterior oropharyngeal edema, posterior oropharyngeal erythema or tonsillar abscesses.  Eyes: Conjunctivae and EOM are normal. Pupils are equal, round, and reactive to light. Right eye exhibits no discharge. Left eye exhibits no discharge. No scleral icterus.  Neck: Normal range of motion and phonation normal. Neck supple. No neck rigidity. Normal range of motion present.  Cardiovascular: Regular rhythm, normal heart sounds and intact distal pulses.  Tachycardia present.   No murmur heard. Pulmonary/Chest: Effort normal and breath sounds normal. No stridor. No respiratory distress. She has no decreased breath sounds. She has no wheezes. She has no rales.  Abdominal: Soft. Bowel sounds are normal. She exhibits no distension. There is no tenderness. There is no rigidity, no rebound, no guarding and no CVA tenderness.  Musculoskeletal: She exhibits no edema or tenderness.  Lymphadenopathy:    She has no cervical adenopathy.  Neurological: She is alert. She is not disoriented. Coordination normal. GCS eye subscore is 4. GCS verbal subscore is 5. GCS motor subscore is 6.  Skin: Skin is warm and dry. She is not diaphoretic.  Psychiatric: She has a normal mood and affect. Her behavior is normal.     ED Treatments / Results  Labs (all labs ordered are listed, but only abnormal results are displayed) Labs Reviewed  COMPREHENSIVE METABOLIC PANEL - Abnormal; Notable for the following:       Result Value   Sodium 132 (*)    Chloride 98 (*)    Glucose, Bld 134 (*)    Creatinine, Ser 1.39 (*)    Calcium 8.1 (*)    AST 13 (*)    ALT 6 (*)    GFR calc  non Af Amer 41 (*)    GFR calc Af Amer 48 (*)    All other components within normal limits  CBC WITH DIFFERENTIAL/PLATELET - Abnormal; Notable for the following:    RBC 2.62 (*)    Hemoglobin 8.8 (*)    HCT 27.4 (*)    MCV 104.6 (*)    Lymphs Abs 0.6 (*)    All other components within normal limits  URINALYSIS, ROUTINE W REFLEX MICROSCOPIC - Abnormal; Notable for the following:    Specific Gravity, Urine 1.004 (*)    Hgb urine dipstick SMALL (*)    All other components within normal limits  URINALYSIS, MICROSCOPIC (REFLEX) - Abnormal; Notable for the following:    Bacteria, UA RARE (*)    Squamous Epithelial / LPF 0-5 (*)    All other components within normal limits  I-STAT CG4 LACTIC ACID, ED - Abnormal; Notable for the following:    Lactic Acid, Venous 2.37 (*)  All other components within normal limits  CULTURE, BLOOD (ROUTINE X 2)  CULTURE, BLOOD (ROUTINE X 2)  URINE CULTURE  INFLUENZA PANEL BY PCR (TYPE A & B)  I-STAT CG4 LACTIC ACID, ED    EKG  EKG Interpretation None       Radiology Dg Chest 2 View  Result Date: 01/06/2017 CLINICAL DATA:  56 year old female with cough, congestion, fevers and chills for the past 2 days EXAM: CHEST  2 VIEW COMPARISON:  Prior chest x-ray 08/08/2013 FINDINGS: The lungs are clear and negative for focal airspace consolidation, pulmonary edema or suspicious pulmonary nodule. No pleural effusion or pneumothorax. Cardiac and mediastinal contours are within normal limits. No acute fracture or lytic or blastic osseous lesions. The visualized upper abdominal bowel gas pattern is unremarkable. IMPRESSION: No active cardiopulmonary disease. Electronically Signed   By: Malachy Moan M.D.   On: 01/06/2017 12:32    Procedures Procedures (including critical care time)  Medications Ordered in ED Medications  vancomycin (VANCOCIN) IVPB 1000 mg/200 mL premix (1,000 mg Intravenous New Bag/Given 01/06/17 1710)  vancomycin (VANCOCIN) IVPB 750  mg/150 ml premix (not administered)  ceFEPIme (MAXIPIME) 1 g injection (not administered)  acetaminophen (TYLENOL) tablet 650 mg (650 mg Oral Given 01/06/17 1217)  sodium chloride 0.9 % bolus 500 mL (0 mLs Intravenous Stopped 01/06/17 1410)  albuterol (PROVENTIL) (2.5 MG/3ML) 0.083% nebulizer solution 5 mg (5 mg Nebulization Given 01/06/17 1254)  sodium chloride 0.9 % bolus 1,000 mL (0 mLs Intravenous Stopped 01/06/17 1740)    And  sodium chloride 0.9 % bolus 500 mL (0 mLs Intravenous Stopped 01/06/17 1740)    And  sodium chloride 0.9 % bolus 250 mL (250 mLs Intravenous New Bag/Given 01/06/17 1712)  oseltamivir (TAMIFLU) capsule 75 mg (75 mg Oral Given 01/06/17 1601)  ceFEPIme (MAXIPIME) 1 g in dextrose 5 % 50 mL IVPB (1 g Intravenous New Bag/Given 01/06/17 1708)   Vitals:   01/06/17 1500 01/06/17 1600 01/06/17 1630 01/06/17 1707  BP: (!) 90/40 97/66 (!) 94/48 (!) 97/48  Pulse: 102 100 92 93  Resp:  13 24 18   Temp:    98.7 F (37.1 C)  TempSrc:    Oral  SpO2: 96% 97% 97%   Weight:      Height:         Initial Impression / Assessment and Plan / ED Course  I have reviewed the triage vital signs and the nursing notes.  Pertinent labs & imaging results that were available during my care of the patient were reviewed by me and considered in my medical decision making (see chart for details).   Patient presents to ED with complaint of influenza like illness onset one day ago. Patient is febrile with 102, non-toxic appearing in NAD. On initial presentation tachycardic otherwise stable vital signs. Lungs CTABL. Abdomen soft, non-tender. Dry mucous membranes. CXR shows no acute cardiopulmonary process. Based on hx and exam suspect influenza like illness. 500cc bolus of IVF given and breathing treatment.   Patient endorsed improvement following breathing treatment. Lungs remain CTABL. Only received ~225ml of fluids due to infiltration. Patient tolerating PO fluids.   1500: BPs soft at ~100s/40s.  Remains mildly tachycardic. On manual check performed by me BP 90/40. Fever improved. Given BP and tachycardia, sepsis work-up initiated; however, code sepsis not called. PO Tamiflu given.   EKG shows sinus rhythm, borderline prolonged QT. Lactic acid mildly elevated. U/A shows no evidence of UTI. CBC shows evidence of anemia; no leukocytosis. Slight hyponatremia and  hypochloremia. Creatinine elevated. Initiated IV ABX for ?bacterial infection.   After approximately of fluids pressures remain hypotensive in 90s/40s, MAPs remain around mid-60's. Patient denies sxs. Given sxs suggestive of influenza, elevated lactic acid, persistent low blood pressures and co-morbid medical conditions will call for admission.    1730: Spoke with Dr. Phineas Semen of hospitalists at [facility]. Has bed available; however, recommends stabilization/improvement in blood pressure prior to admission, requests continued IVFs and re-evaluation, once improved pressures, call for admission. Thank you for your time and continued care of this patient.   Care of patient signed out to [facility], PA-C. Continue IVF, re-assess, improved BPs re-page [facility] hospitalist and transfer.    Final Clinical Impressions(s) / ED Diagnoses   Final diagnoses:  Influenza-like illness  Sepsis, due to unspecified organism [facility])    New Prescriptions New Prescriptions   BENZONATATE (TESSALON) 100 MG CAPSULE    Take 1 capsule (100 mg total) by mouth every 8 (eight) hours.   OSELTAMIVIR (TAMIFLU) 75 MG CAPSULE    Take 1 capsule (75 mg total) by mouth every 12 (twelve) hours.     Lona Kettle, PA-C 01/06/17 1755    [address] [place], PA-C 01/06/17 1757    Rolland Porter, MD 01/15/17 2013

## 2017-01-06 NOTE — ED Triage Notes (Signed)
States " I think I have the flu" had flu shot this season. Chill, cough, aching all over, h/a. Took Ibuprofen last night.

## 2017-01-06 NOTE — ED Notes (Signed)
EMT went into room to help pt to bathroom as soon as [employer] operational officer called out. Pt not happy that it took this EMT so long to get there. Pt states "I just peed my pants." EMT offered pt some paper scrub pants, pt not happy and said "No, I'm not wearing those paper things. I'll freeze to death. I'd rather wear these wet pants." EMT notified RN.

## 2017-01-07 DIAGNOSIS — R0602 Shortness of breath: Secondary | ICD-10-CM | POA: Diagnosis not present

## 2017-01-07 DIAGNOSIS — E1140 Type 2 diabetes mellitus with diabetic neuropathy, unspecified: Secondary | ICD-10-CM | POA: Diagnosis not present

## 2017-01-07 DIAGNOSIS — D649 Anemia, unspecified: Secondary | ICD-10-CM | POA: Diagnosis not present

## 2017-01-07 DIAGNOSIS — Z880 Allergy status to penicillin: Secondary | ICD-10-CM | POA: Diagnosis not present

## 2017-01-07 DIAGNOSIS — J449 Chronic obstructive pulmonary disease, unspecified: Secondary | ICD-10-CM | POA: Diagnosis not present

## 2017-01-07 DIAGNOSIS — Z7984 Long term (current) use of oral hypoglycemic drugs: Secondary | ICD-10-CM | POA: Diagnosis not present

## 2017-01-07 DIAGNOSIS — A419 Sepsis, unspecified organism: Secondary | ICD-10-CM | POA: Diagnosis not present

## 2017-01-07 DIAGNOSIS — A4189 Other specified sepsis: Secondary | ICD-10-CM | POA: Diagnosis not present

## 2017-01-07 DIAGNOSIS — G894 Chronic pain syndrome: Secondary | ICD-10-CM | POA: Diagnosis not present

## 2017-01-07 DIAGNOSIS — E872 Acidosis: Secondary | ICD-10-CM | POA: Diagnosis not present

## 2017-01-07 DIAGNOSIS — M5136 Other intervertebral disc degeneration, lumbar region: Secondary | ICD-10-CM | POA: Diagnosis not present

## 2017-01-07 DIAGNOSIS — B9789 Other viral agents as the cause of diseases classified elsewhere: Secondary | ICD-10-CM | POA: Diagnosis not present

## 2017-01-07 DIAGNOSIS — E785 Hyperlipidemia, unspecified: Secondary | ICD-10-CM | POA: Diagnosis not present

## 2017-01-07 DIAGNOSIS — E1142 Type 2 diabetes mellitus with diabetic polyneuropathy: Secondary | ICD-10-CM | POA: Diagnosis not present

## 2017-01-07 LAB — INFLUENZA PANEL BY PCR (TYPE A & B)
Influenza A By PCR: POSITIVE — AB
Influenza B By PCR: NEGATIVE

## 2017-01-08 LAB — URINE CULTURE

## 2017-01-11 DIAGNOSIS — M545 Low back pain: Secondary | ICD-10-CM | POA: Diagnosis not present

## 2017-01-11 DIAGNOSIS — G894 Chronic pain syndrome: Secondary | ICD-10-CM | POA: Diagnosis not present

## 2017-01-11 LAB — CULTURE, BLOOD (ROUTINE X 2)
CULTURE: NO GROWTH
CULTURE: NO GROWTH

## 2017-01-18 DIAGNOSIS — Z8619 Personal history of other infectious and parasitic diseases: Secondary | ICD-10-CM | POA: Diagnosis not present

## 2017-01-18 DIAGNOSIS — I10 Essential (primary) hypertension: Secondary | ICD-10-CM | POA: Diagnosis not present

## 2017-01-18 DIAGNOSIS — E1140 Type 2 diabetes mellitus with diabetic neuropathy, unspecified: Secondary | ICD-10-CM | POA: Diagnosis not present

## 2017-01-18 DIAGNOSIS — J449 Chronic obstructive pulmonary disease, unspecified: Secondary | ICD-10-CM | POA: Diagnosis not present

## 2017-01-18 DIAGNOSIS — E785 Hyperlipidemia, unspecified: Secondary | ICD-10-CM | POA: Diagnosis not present

## 2017-01-25 DIAGNOSIS — E785 Hyperlipidemia, unspecified: Secondary | ICD-10-CM | POA: Diagnosis not present

## 2017-01-25 DIAGNOSIS — J449 Chronic obstructive pulmonary disease, unspecified: Secondary | ICD-10-CM | POA: Diagnosis not present

## 2017-01-25 DIAGNOSIS — I10 Essential (primary) hypertension: Secondary | ICD-10-CM | POA: Diagnosis not present

## 2017-01-25 DIAGNOSIS — E1140 Type 2 diabetes mellitus with diabetic neuropathy, unspecified: Secondary | ICD-10-CM | POA: Diagnosis not present

## 2017-01-25 DIAGNOSIS — Z8619 Personal history of other infectious and parasitic diseases: Secondary | ICD-10-CM | POA: Diagnosis not present

## 2017-01-25 DIAGNOSIS — G894 Chronic pain syndrome: Secondary | ICD-10-CM | POA: Diagnosis not present

## 2017-02-09 DIAGNOSIS — G894 Chronic pain syndrome: Secondary | ICD-10-CM | POA: Diagnosis not present

## 2017-02-09 DIAGNOSIS — M545 Low back pain: Secondary | ICD-10-CM | POA: Diagnosis not present

## 2017-03-09 DIAGNOSIS — R1013 Epigastric pain: Secondary | ICD-10-CM | POA: Diagnosis not present

## 2017-03-09 DIAGNOSIS — G894 Chronic pain syndrome: Secondary | ICD-10-CM | POA: Diagnosis not present

## 2017-03-09 DIAGNOSIS — M545 Low back pain: Secondary | ICD-10-CM | POA: Diagnosis not present

## 2017-04-13 DIAGNOSIS — M545 Low back pain: Secondary | ICD-10-CM | POA: Diagnosis not present

## 2017-04-13 DIAGNOSIS — G894 Chronic pain syndrome: Secondary | ICD-10-CM | POA: Diagnosis not present

## 2017-05-10 DIAGNOSIS — G894 Chronic pain syndrome: Secondary | ICD-10-CM | POA: Diagnosis not present

## 2017-05-10 DIAGNOSIS — M545 Low back pain: Secondary | ICD-10-CM | POA: Diagnosis not present

## 2017-06-07 DIAGNOSIS — M545 Low back pain: Secondary | ICD-10-CM | POA: Diagnosis not present

## 2017-06-07 DIAGNOSIS — G894 Chronic pain syndrome: Secondary | ICD-10-CM | POA: Diagnosis not present

## 2017-07-10 DIAGNOSIS — G894 Chronic pain syndrome: Secondary | ICD-10-CM | POA: Diagnosis not present

## 2017-07-10 DIAGNOSIS — M545 Low back pain: Secondary | ICD-10-CM | POA: Diagnosis not present

## 2017-08-08 DIAGNOSIS — M545 Low back pain: Secondary | ICD-10-CM | POA: Diagnosis not present

## 2017-08-08 DIAGNOSIS — G894 Chronic pain syndrome: Secondary | ICD-10-CM | POA: Diagnosis not present

## 2017-09-05 DIAGNOSIS — M545 Low back pain: Secondary | ICD-10-CM | POA: Diagnosis not present

## 2017-09-05 DIAGNOSIS — G894 Chronic pain syndrome: Secondary | ICD-10-CM | POA: Diagnosis not present

## 2017-09-07 IMAGING — CR DG CHEST 2V
2 series · 2 of 2 positions shown · non-contrast
Comparison: Prior chest x-ray 08/08/2013

CLINICAL DATA: 55-year-old female with cough, congestion, fevers
and chills for the past 2 days

EXAM:
CHEST  2 VIEW

[w chest pa]
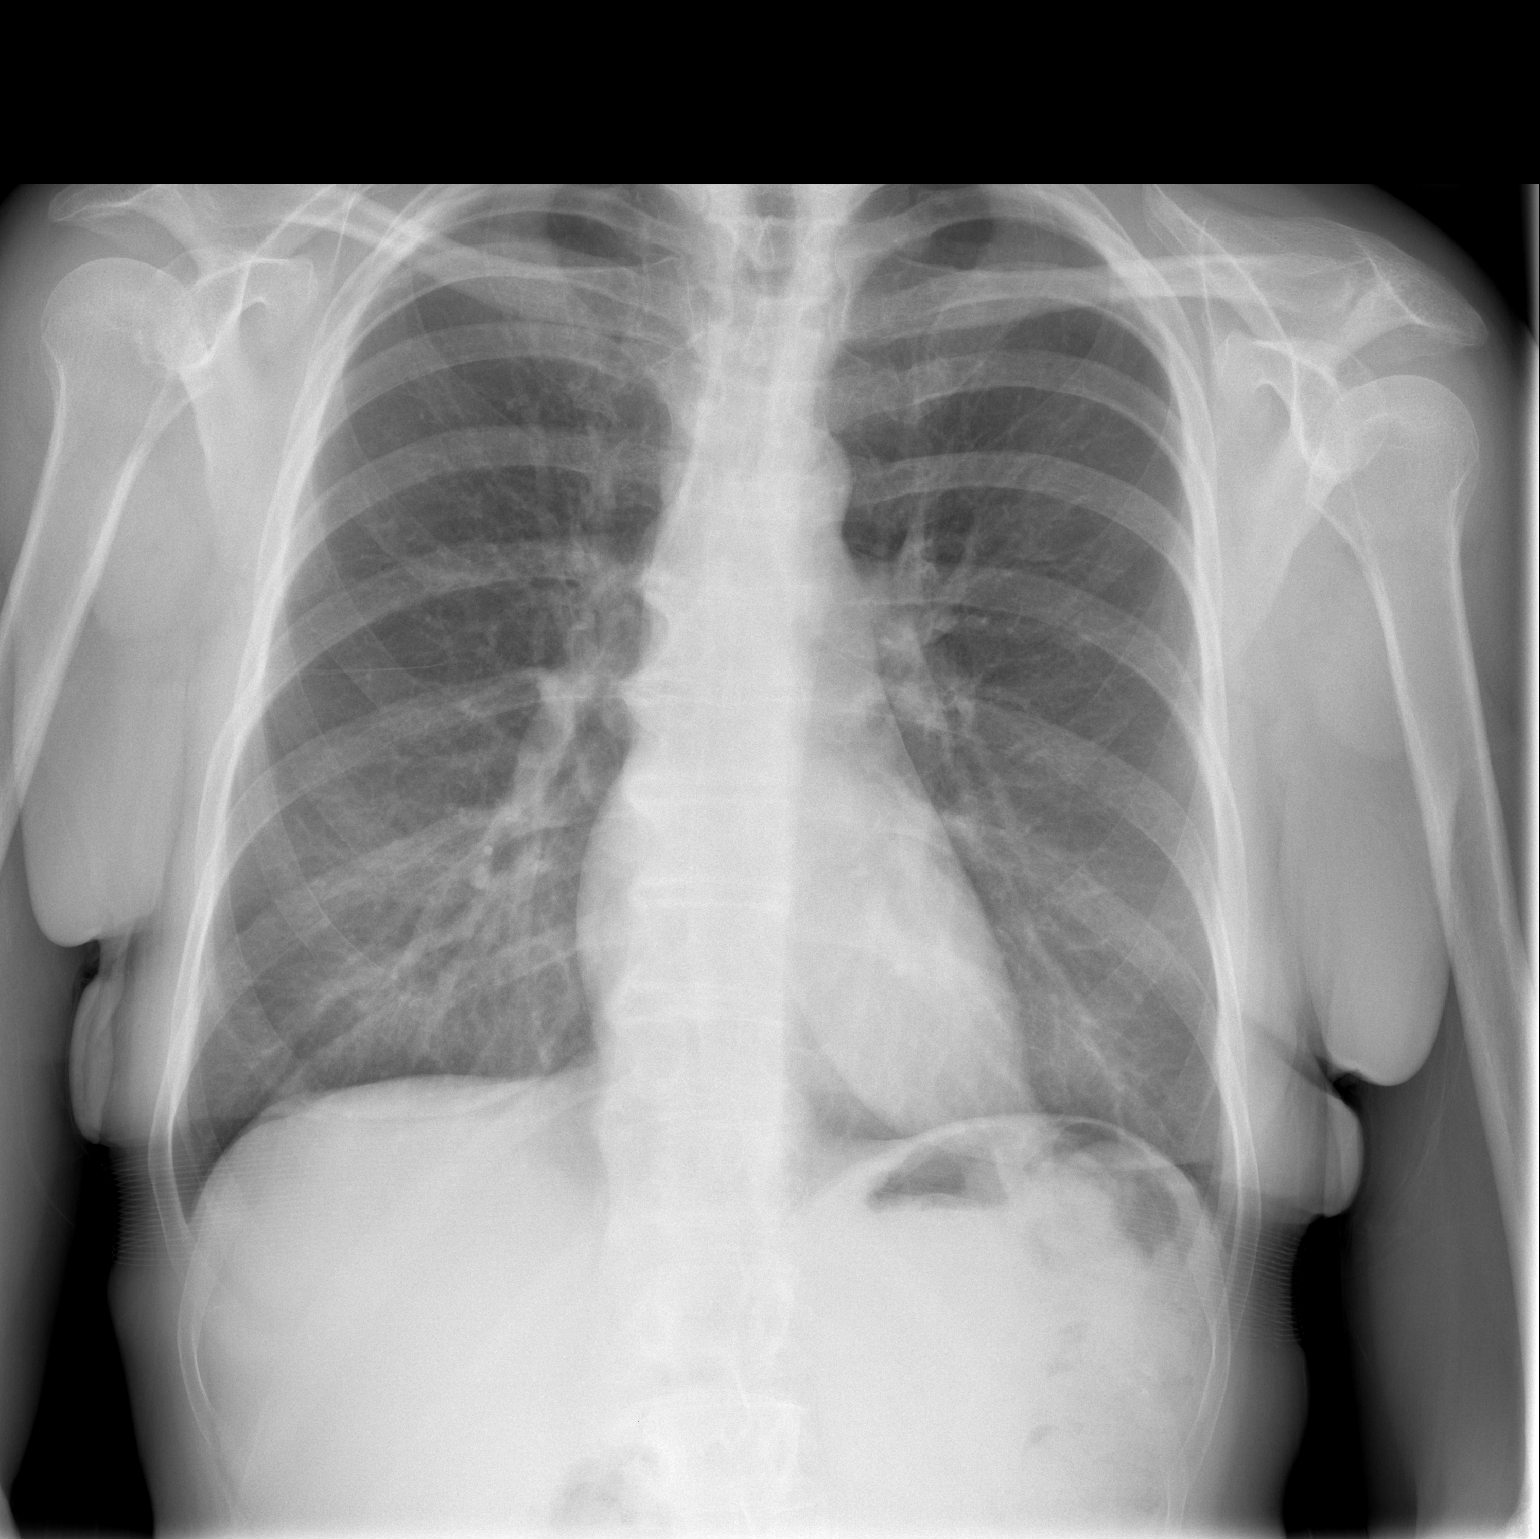

[w chest lat]
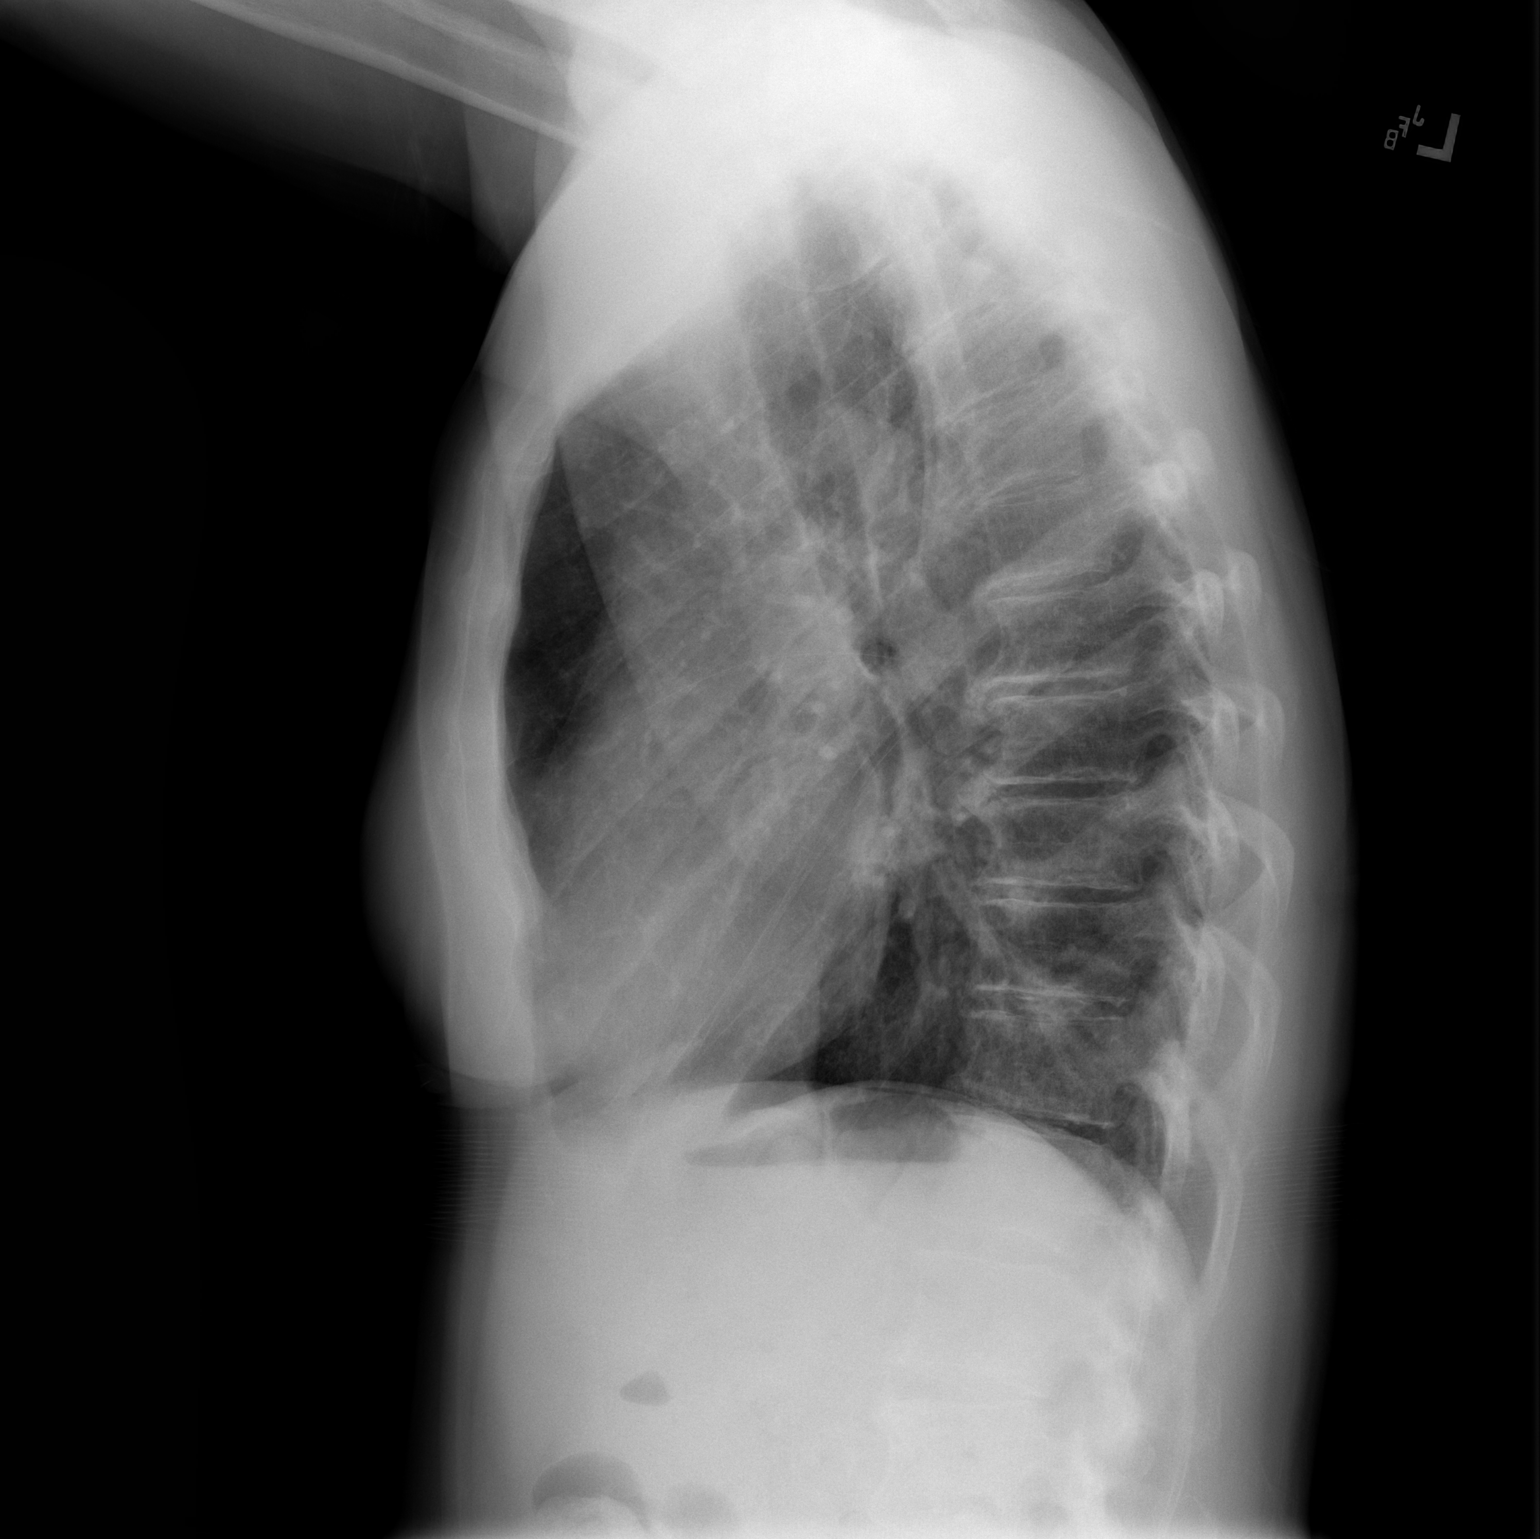

[2 of 2 positions shown; findings below may reference images not displayed]

FINDINGS: The lungs are clear and negative for focal airspace consolidation,
pulmonary edema or suspicious pulmonary nodule. No pleural effusion
or pneumothorax. Cardiac and mediastinal contours are within normal
limits. No acute fracture or lytic or blastic osseous lesions. The
visualized upper abdominal bowel gas pattern is unremarkable.
IMPRESSION: No active cardiopulmonary disease.

## 2017-09-18 DIAGNOSIS — G894 Chronic pain syndrome: Secondary | ICD-10-CM | POA: Diagnosis not present

## 2017-09-18 DIAGNOSIS — I10 Essential (primary) hypertension: Secondary | ICD-10-CM | POA: Diagnosis not present

## 2017-09-18 DIAGNOSIS — E559 Vitamin D deficiency, unspecified: Secondary | ICD-10-CM | POA: Diagnosis not present

## 2017-09-18 DIAGNOSIS — E1140 Type 2 diabetes mellitus with diabetic neuropathy, unspecified: Secondary | ICD-10-CM | POA: Diagnosis not present

## 2017-09-18 DIAGNOSIS — E785 Hyperlipidemia, unspecified: Secondary | ICD-10-CM | POA: Diagnosis not present

## 2017-09-18 DIAGNOSIS — J449 Chronic obstructive pulmonary disease, unspecified: Secondary | ICD-10-CM | POA: Diagnosis not present

## 2017-09-28 DIAGNOSIS — I10 Essential (primary) hypertension: Secondary | ICD-10-CM | POA: Diagnosis not present

## 2017-09-28 DIAGNOSIS — J449 Chronic obstructive pulmonary disease, unspecified: Secondary | ICD-10-CM | POA: Diagnosis not present

## 2017-09-28 DIAGNOSIS — E785 Hyperlipidemia, unspecified: Secondary | ICD-10-CM | POA: Diagnosis not present

## 2017-09-28 DIAGNOSIS — N390 Urinary tract infection, site not specified: Secondary | ICD-10-CM | POA: Diagnosis not present

## 2017-09-28 DIAGNOSIS — R300 Dysuria: Secondary | ICD-10-CM | POA: Diagnosis not present

## 2017-09-28 DIAGNOSIS — E1140 Type 2 diabetes mellitus with diabetic neuropathy, unspecified: Secondary | ICD-10-CM | POA: Diagnosis not present

## 2017-10-10 DIAGNOSIS — Z79891 Long term (current) use of opiate analgesic: Secondary | ICD-10-CM | POA: Diagnosis not present

## 2017-10-10 DIAGNOSIS — G894 Chronic pain syndrome: Secondary | ICD-10-CM | POA: Diagnosis not present

## 2017-10-10 DIAGNOSIS — M545 Low back pain: Secondary | ICD-10-CM | POA: Diagnosis not present

## 2017-10-16 DIAGNOSIS — M25551 Pain in right hip: Secondary | ICD-10-CM | POA: Diagnosis not present

## 2017-10-16 DIAGNOSIS — Y92410 Unspecified street and highway as the place of occurrence of the external cause: Secondary | ICD-10-CM | POA: Diagnosis not present

## 2017-10-16 DIAGNOSIS — S39012A Strain of muscle, fascia and tendon of lower back, initial encounter: Secondary | ICD-10-CM | POA: Diagnosis not present

## 2017-10-16 DIAGNOSIS — Y999 Unspecified external cause status: Secondary | ICD-10-CM | POA: Diagnosis not present

## 2017-11-16 DIAGNOSIS — G894 Chronic pain syndrome: Secondary | ICD-10-CM | POA: Diagnosis not present

## 2017-11-16 DIAGNOSIS — M545 Low back pain: Secondary | ICD-10-CM | POA: Diagnosis not present

## 2017-11-16 DIAGNOSIS — Z79891 Long term (current) use of opiate analgesic: Secondary | ICD-10-CM | POA: Diagnosis not present

## 2017-12-14 DIAGNOSIS — Z79891 Long term (current) use of opiate analgesic: Secondary | ICD-10-CM | POA: Diagnosis not present

## 2017-12-14 DIAGNOSIS — M545 Low back pain: Secondary | ICD-10-CM | POA: Diagnosis not present

## 2017-12-14 DIAGNOSIS — G894 Chronic pain syndrome: Secondary | ICD-10-CM | POA: Diagnosis not present

## 2018-01-11 DIAGNOSIS — Z79891 Long term (current) use of opiate analgesic: Secondary | ICD-10-CM | POA: Diagnosis not present

## 2018-01-11 DIAGNOSIS — M545 Low back pain: Secondary | ICD-10-CM | POA: Diagnosis not present

## 2018-01-11 DIAGNOSIS — G894 Chronic pain syndrome: Secondary | ICD-10-CM | POA: Diagnosis not present

## 2018-02-05 DIAGNOSIS — Z79891 Long term (current) use of opiate analgesic: Secondary | ICD-10-CM | POA: Diagnosis not present

## 2018-02-05 DIAGNOSIS — M545 Low back pain: Secondary | ICD-10-CM | POA: Diagnosis not present

## 2018-02-05 DIAGNOSIS — G894 Chronic pain syndrome: Secondary | ICD-10-CM | POA: Diagnosis not present

## 2018-03-06 DIAGNOSIS — Z79891 Long term (current) use of opiate analgesic: Secondary | ICD-10-CM | POA: Diagnosis not present

## 2018-03-06 DIAGNOSIS — M545 Low back pain: Secondary | ICD-10-CM | POA: Diagnosis not present

## 2018-03-06 DIAGNOSIS — G894 Chronic pain syndrome: Secondary | ICD-10-CM | POA: Diagnosis not present

## 2018-03-27 DIAGNOSIS — E785 Hyperlipidemia, unspecified: Secondary | ICD-10-CM | POA: Diagnosis not present

## 2018-03-27 DIAGNOSIS — R52 Pain, unspecified: Secondary | ICD-10-CM | POA: Diagnosis not present

## 2018-03-27 DIAGNOSIS — E1140 Type 2 diabetes mellitus with diabetic neuropathy, unspecified: Secondary | ICD-10-CM | POA: Diagnosis not present

## 2018-03-27 DIAGNOSIS — I10 Essential (primary) hypertension: Secondary | ICD-10-CM | POA: Diagnosis not present

## 2018-03-27 DIAGNOSIS — J449 Chronic obstructive pulmonary disease, unspecified: Secondary | ICD-10-CM | POA: Diagnosis not present

## 2018-04-03 DIAGNOSIS — M545 Low back pain: Secondary | ICD-10-CM | POA: Diagnosis not present

## 2018-04-03 DIAGNOSIS — G894 Chronic pain syndrome: Secondary | ICD-10-CM | POA: Diagnosis not present

## 2018-04-03 DIAGNOSIS — Z79891 Long term (current) use of opiate analgesic: Secondary | ICD-10-CM | POA: Diagnosis not present

## 2018-05-01 DIAGNOSIS — Z79891 Long term (current) use of opiate analgesic: Secondary | ICD-10-CM | POA: Diagnosis not present

## 2018-05-01 DIAGNOSIS — M545 Low back pain: Secondary | ICD-10-CM | POA: Diagnosis not present

## 2018-05-01 DIAGNOSIS — G894 Chronic pain syndrome: Secondary | ICD-10-CM | POA: Diagnosis not present

## 2018-06-05 DIAGNOSIS — M545 Low back pain: Secondary | ICD-10-CM | POA: Diagnosis not present

## 2018-06-05 DIAGNOSIS — Z79891 Long term (current) use of opiate analgesic: Secondary | ICD-10-CM | POA: Diagnosis not present

## 2018-06-05 DIAGNOSIS — G894 Chronic pain syndrome: Secondary | ICD-10-CM | POA: Diagnosis not present

## 2018-07-01 DIAGNOSIS — Z1231 Encounter for screening mammogram for malignant neoplasm of breast: Secondary | ICD-10-CM | POA: Diagnosis not present

## 2018-07-03 DIAGNOSIS — Z79891 Long term (current) use of opiate analgesic: Secondary | ICD-10-CM | POA: Diagnosis not present

## 2018-07-03 DIAGNOSIS — G894 Chronic pain syndrome: Secondary | ICD-10-CM | POA: Diagnosis not present

## 2018-07-03 DIAGNOSIS — M545 Low back pain: Secondary | ICD-10-CM | POA: Diagnosis not present

## 2018-07-10 DIAGNOSIS — E785 Hyperlipidemia, unspecified: Secondary | ICD-10-CM | POA: Diagnosis not present

## 2018-07-10 DIAGNOSIS — J449 Chronic obstructive pulmonary disease, unspecified: Secondary | ICD-10-CM | POA: Diagnosis not present

## 2018-07-10 DIAGNOSIS — D649 Anemia, unspecified: Secondary | ICD-10-CM | POA: Diagnosis not present

## 2018-07-10 DIAGNOSIS — E1140 Type 2 diabetes mellitus with diabetic neuropathy, unspecified: Secondary | ICD-10-CM | POA: Diagnosis not present

## 2018-07-10 DIAGNOSIS — I10 Essential (primary) hypertension: Secondary | ICD-10-CM | POA: Diagnosis not present

## 2018-07-19 DIAGNOSIS — E875 Hyperkalemia: Secondary | ICD-10-CM | POA: Diagnosis not present

## 2018-07-19 DIAGNOSIS — I10 Essential (primary) hypertension: Secondary | ICD-10-CM | POA: Diagnosis not present

## 2018-07-19 DIAGNOSIS — J449 Chronic obstructive pulmonary disease, unspecified: Secondary | ICD-10-CM | POA: Diagnosis not present

## 2018-07-19 DIAGNOSIS — E785 Hyperlipidemia, unspecified: Secondary | ICD-10-CM | POA: Diagnosis not present

## 2018-07-23 DIAGNOSIS — H524 Presbyopia: Secondary | ICD-10-CM | POA: Diagnosis not present

## 2018-07-23 DIAGNOSIS — H2513 Age-related nuclear cataract, bilateral: Secondary | ICD-10-CM | POA: Diagnosis not present

## 2018-07-23 DIAGNOSIS — E119 Type 2 diabetes mellitus without complications: Secondary | ICD-10-CM | POA: Diagnosis not present

## 2018-07-23 DIAGNOSIS — Z7984 Long term (current) use of oral hypoglycemic drugs: Secondary | ICD-10-CM | POA: Diagnosis not present

## 2018-07-23 DIAGNOSIS — H5203 Hypermetropia, bilateral: Secondary | ICD-10-CM | POA: Diagnosis not present

## 2018-08-06 DIAGNOSIS — R52 Pain, unspecified: Secondary | ICD-10-CM | POA: Diagnosis not present

## 2018-08-06 DIAGNOSIS — E785 Hyperlipidemia, unspecified: Secondary | ICD-10-CM | POA: Diagnosis not present

## 2018-08-06 DIAGNOSIS — E1140 Type 2 diabetes mellitus with diabetic neuropathy, unspecified: Secondary | ICD-10-CM | POA: Diagnosis not present

## 2018-08-06 DIAGNOSIS — I10 Essential (primary) hypertension: Secondary | ICD-10-CM | POA: Diagnosis not present

## 2018-08-06 DIAGNOSIS — R05 Cough: Secondary | ICD-10-CM | POA: Diagnosis not present

## 2018-08-06 DIAGNOSIS — R5081 Fever presenting with conditions classified elsewhere: Secondary | ICD-10-CM | POA: Diagnosis not present

## 2018-08-12 DIAGNOSIS — G894 Chronic pain syndrome: Secondary | ICD-10-CM | POA: Diagnosis not present

## 2018-08-12 DIAGNOSIS — Z79891 Long term (current) use of opiate analgesic: Secondary | ICD-10-CM | POA: Diagnosis not present

## 2018-08-12 DIAGNOSIS — M545 Low back pain: Secondary | ICD-10-CM | POA: Diagnosis not present

## 2018-09-09 DIAGNOSIS — G894 Chronic pain syndrome: Secondary | ICD-10-CM | POA: Diagnosis not present

## 2018-09-09 DIAGNOSIS — M545 Low back pain: Secondary | ICD-10-CM | POA: Diagnosis not present

## 2018-09-09 DIAGNOSIS — Z79891 Long term (current) use of opiate analgesic: Secondary | ICD-10-CM | POA: Diagnosis not present

## 2018-10-02 DIAGNOSIS — M5440 Lumbago with sciatica, unspecified side: Secondary | ICD-10-CM | POA: Diagnosis not present

## 2018-10-02 DIAGNOSIS — M47816 Spondylosis without myelopathy or radiculopathy, lumbar region: Secondary | ICD-10-CM | POA: Diagnosis not present

## 2018-10-02 DIAGNOSIS — M5126 Other intervertebral disc displacement, lumbar region: Secondary | ICD-10-CM | POA: Diagnosis not present

## 2018-10-09 DIAGNOSIS — R110 Nausea: Secondary | ICD-10-CM | POA: Diagnosis not present

## 2018-10-09 DIAGNOSIS — G4489 Other headache syndrome: Secondary | ICD-10-CM | POA: Diagnosis not present

## 2018-10-09 DIAGNOSIS — Z743 Need for continuous supervision: Secondary | ICD-10-CM | POA: Diagnosis not present

## 2018-10-09 DIAGNOSIS — R509 Fever, unspecified: Secondary | ICD-10-CM | POA: Diagnosis not present

## 2018-10-23 DIAGNOSIS — M545 Low back pain: Secondary | ICD-10-CM | POA: Diagnosis not present

## 2018-10-23 DIAGNOSIS — Z79891 Long term (current) use of opiate analgesic: Secondary | ICD-10-CM | POA: Diagnosis not present

## 2018-10-23 DIAGNOSIS — G894 Chronic pain syndrome: Secondary | ICD-10-CM | POA: Diagnosis not present

## 2018-11-03 ENCOUNTER — Emergency Department (HOSPITAL_BASED_OUTPATIENT_CLINIC_OR_DEPARTMENT_OTHER): Payer: Medicare Other

## 2018-11-03 ENCOUNTER — Other Ambulatory Visit: Payer: Self-pay

## 2018-11-03 ENCOUNTER — Emergency Department (HOSPITAL_BASED_OUTPATIENT_CLINIC_OR_DEPARTMENT_OTHER)
Admission: EM | Admit: 2018-11-03 | Discharge: 2018-11-03 | Disposition: A | Discharge status: Home / Self Care | Payer: Medicare Other | Attending: Emergency Medicine | Admitting: Emergency Medicine

## 2018-11-03 ENCOUNTER — Encounter (HOSPITAL_BASED_OUTPATIENT_CLINIC_OR_DEPARTMENT_OTHER): Payer: Self-pay | Admitting: Emergency Medicine

## 2018-11-03 DIAGNOSIS — R059 Cough, unspecified: Secondary | ICD-10-CM

## 2018-11-03 DIAGNOSIS — J40 Bronchitis, not specified as acute or chronic: Secondary | ICD-10-CM | POA: Diagnosis not present

## 2018-11-03 DIAGNOSIS — E119 Type 2 diabetes mellitus without complications: Secondary | ICD-10-CM | POA: Diagnosis not present

## 2018-11-03 DIAGNOSIS — J449 Chronic obstructive pulmonary disease, unspecified: Secondary | ICD-10-CM | POA: Insufficient documentation

## 2018-11-03 DIAGNOSIS — R05 Cough: Secondary | ICD-10-CM

## 2018-11-03 DIAGNOSIS — R42 Dizziness and giddiness: Secondary | ICD-10-CM | POA: Diagnosis not present

## 2018-11-03 DIAGNOSIS — R51 Headache: Secondary | ICD-10-CM | POA: Diagnosis not present

## 2018-11-03 DIAGNOSIS — B349 Viral infection, unspecified: Secondary | ICD-10-CM

## 2018-11-03 DIAGNOSIS — F17200 Nicotine dependence, unspecified, uncomplicated: Secondary | ICD-10-CM | POA: Diagnosis not present

## 2018-11-03 LAB — CBC
HCT: 36.4 % (ref 36.0–46.0)
Hemoglobin: 11.3 g/dL — ABNORMAL LOW (ref 12.0–15.0)
MCH: 31.7 pg (ref 26.0–34.0)
MCHC: 31 g/dL (ref 30.0–36.0)
MCV: 102.2 fL — ABNORMAL HIGH (ref 80.0–100.0)
NRBC: 0 % (ref 0.0–0.2)
PLATELETS: 305 10*3/uL (ref 150–400)
RBC: 3.56 MIL/uL — AB (ref 3.87–5.11)
RDW: 14.2 % (ref 11.5–15.5)
WBC: 11.2 10*3/uL — ABNORMAL HIGH (ref 4.0–10.5)

## 2018-11-03 LAB — BASIC METABOLIC PANEL
ANION GAP: 10 (ref 5–15)
BUN: 24 mg/dL — ABNORMAL HIGH (ref 6–20)
CALCIUM: 9 mg/dL (ref 8.9–10.3)
CO2: 27 mmol/L (ref 22–32)
CREATININE: 1.58 mg/dL — AB (ref 0.44–1.00)
Chloride: 96 mmol/L — ABNORMAL LOW (ref 98–111)
GFR, EST AFRICAN AMERICAN: 41 mL/min — AB (ref >60)
GFR, EST NON AFRICAN AMERICAN: 36 mL/min — AB (ref >60)
GLUCOSE: 264 mg/dL — AB (ref 70–99)
Potassium: 4.8 mmol/L (ref 3.5–5.1)
Sodium: 133 mmol/L — ABNORMAL LOW (ref 135–145)

## 2018-11-03 LAB — URINALYSIS, MICROSCOPIC (REFLEX)

## 2018-11-03 LAB — URINALYSIS, ROUTINE W REFLEX MICROSCOPIC
Bilirubin Urine: NEGATIVE
GLUCOSE, UA: 250 mg/dL — AB
KETONES UR: NEGATIVE mg/dL
Leukocytes, UA: NEGATIVE
Nitrite: NEGATIVE
PROTEIN: NEGATIVE mg/dL
Specific Gravity, Urine: <1.005 — ABNORMAL LOW (ref 1.005–1.030)
pH: 6 (ref 5.0–8.0)

## 2018-11-03 MED ORDER — ONDANSETRON 4 MG PO TBDP
4.0000 mg | ORAL_TABLET | Freq: Once | ORAL | Status: AC
  Administered 2018-11-03: 4 mg via ORAL
  Filled 2018-11-03: qty 1

## 2018-11-03 MED ORDER — KETOROLAC TROMETHAMINE 30 MG/ML IJ SOLN
30.0000 mg | Freq: Once | INTRAMUSCULAR | Status: AC
  Administered 2018-11-03: 30 mg via INTRAMUSCULAR
  Filled 2018-11-03: qty 1

## 2018-11-03 NOTE — ED Notes (Signed)
Patient transported to X-ray 

## 2018-11-03 NOTE — ED Notes (Signed)
Patient transported to CT 

## 2018-11-03 NOTE — ED Notes (Signed)
Pt stating she has been having periods of confusion, intermittent dizziness, dropping things, and muscles jerking x 3-4 days. Pt also states she had one period of incontinence while she was sleeping hard on the cough yesterday.

## 2018-11-03 NOTE — Discharge Instructions (Addendum)
You were evaluated in the Emergency Department and after careful evaluation, we did not find any emergent condition requiring admission or further testing in the hospital.  Your symptoms today seem to be due to continued bronchitis from a viral illness.  Please return to the Emergency Department if you experience any worsening of your condition.  We encourage you to follow up with a primary care provider.  Thank you for allowing [place] to be a part of your care.

## 2018-11-03 NOTE — ED Triage Notes (Signed)
Reports dizziness and feeling more tired than normal.  Reports problems with potassium.  Additionally states that she has had congestion and cough.

## 2018-11-03 NOTE — ED Provider Notes (Signed)
[facility] Emergency Department Provider Note MRN:  [id number]  Arrival date & time: 11/03/18     Chief Complaint   Dizziness   History of Present Illness   Amy Montoya is a 57 y.o. year-old female with a history of COPD, chronic pain, diabetes, neuropathy presenting to the ED with chief complaint of dizziness.  4 to 5 days of increased confusion, described as "discombobulated".  Daughter reports balance issues.  2 days ago patient had a episode of urinary incontinence while sleeping.  Patient reports an unwitnessed fall in an elevator yesterday.  Recent headaches for the past 2 to 3 days, which is abnormal for her.  Denies fever, no neck pain, no vision change.  Denies chest pain or shortness of breath, but does endorse recent increased cough, has been recently given antibiotics for bronchitis.  Denies abdominal pain, no new numbness or weakness to the arms or legs.  Review of Systems  A complete 10 system review of systems was obtained and all systems are negative except as noted in the HPI and PMH.   Patient's Health History    Past Medical History:  Diagnosis Date  . Arthritis   . Chronic pain   . COPD (chronic obstructive pulmonary disease) (HCC)   . Diabetes mellitus   . Neuropathy     Past Surgical History:  Procedure Laterality Date  . ANKLE SURGERY    . CESAREAN SECTION    . TONSILLECTOMY      History reviewed. No pertinent family history.  Social History   Socioeconomic History  . Marital status: Widowed    Spouse name: Not on file  . Number of children: Not on file  . Years of education: Not on file  . Highest education level: Not on file  Occupational History  . Not on file  Social Needs  . Financial resource strain: Not on file  . Food insecurity:    Worry: Not on file    Inability: Not on file  . Transportation needs:    Medical: Not on file    Non-medical: Not on file  Tobacco Use  . Smoking status: Current Every Day  Smoker    Packs/day: 1.00  . Smokeless tobacco: Never Used  Substance and Sexual Activity  . Alcohol use: No  . Drug use: No  . Sexual activity: Not on file  Lifestyle  . Physical activity:    Days per week: Not on file    Minutes per session: Not on file  . Stress: Not on file  Relationships  . Social connections:    Talks on phone: Not on file    Gets together: Not on file    Attends religious service: Not on file    Active member of club or organization: Not on file    Attends meetings of clubs or organizations: Not on file    Relationship status: Not on file  . Intimate partner violence:    Fear of current or ex partner: Not on file    Emotionally abused: Not on file    Physically abused: Not on file    Forced sexual activity: Not on file  Other Topics Concern  . Not on file  Social History Narrative  . Not on file     Physical Exam  Vital Signs and Nursing Notes reviewed Vitals:   11/03/18 1530 11/03/18 1600  BP: 140/69 (!) 141/64  Pulse: 89 87  Resp: 15 17  Temp:  SpO2: 95% 98%    CONSTITUTIONAL: Well-appearing, NAD NEURO:  Alert and oriented x 3, no focal deficits EYES:  eyes equal and reactive ENT/NECK:  no LAD, no JVD CARDIO: Regular rate, well-perfused, normal S1 and S2 PULM:  CTAB no wheezing or rhonchi GI/GU:  normal bowel sounds, non-distended, non-tender MSK/SPINE:  No gross deformities, no edema SKIN:  no rash, atraumatic PSYCH:  Appropriate speech and behavior  Diagnostic and Interventional Summary    EKG Interpretation  Date/Time:  Sunday November 03 2018 12:59:40 EST Ventricular Rate:  91 PR Interval:  118 QRS Duration: 84 QT Interval:  364 QTC Calculation: 447 R Axis:   90 Text Interpretation:  Normal sinus rhythm Possible Left atrial enlargement Rightward axis Borderline ECG normal, no change from old Confirmed by Arby Barrette [phone]) on 11/03/2018 3:13:13 PM      Labs Reviewed  BASIC METABOLIC PANEL - Abnormal; Notable for  the following components:      Result Value   Sodium 133 (*)    Chloride 96 (*)    Glucose, Bld 264 (*)    BUN 24 (*)    Creatinine, Ser 1.58 (*)    GFR calc non Af Amer 36 (*)    GFR calc Af Amer 41 (*)    All other components within normal limits  CBC - Abnormal; Notable for the following components:   WBC 11.2 (*)    RBC 3.56 (*)    Hemoglobin 11.3 (*)    MCV 102.2 (*)    All other components within normal limits  URINALYSIS, ROUTINE W REFLEX MICROSCOPIC - Abnormal; Notable for the following components:   Specific Gravity, Urine <1.005 (*)    Glucose, UA 250 (*)    Hgb urine dipstick TRACE (*)    All other components within normal limits  URINALYSIS, MICROSCOPIC (REFLEX) - Abnormal; Notable for the following components:   Bacteria, UA RARE (*)    All other components within normal limits    DG Chest 2 View  Final Result    CT Head Wo Contrast  Final Result      Medications  ketorolac (TORADOL) 30 MG/ML injection 30 mg (30 mg Intramuscular Given 11/03/18 1523)  ondansetron (ZOFRAN-ODT) disintegrating tablet 4 mg (4 mg Oral Given 11/03/18 1559)     Procedures Critical Care  ED Course and Medical Decision Making  I have reviewed the triage vital signs and the nursing notes.  Pertinent labs & imaging results that were available during my care of the patient were reviewed by me and considered in my medical decision making (see below for details).  Initially suspecting hypokalemia in this 57 year old female with history of the same here with confusion, weakness, possible ataxia.  Labs reveal normal potassium.  Also suspecting CNS abnormality given recent headaches, incontinence, ataxia, confusion--possibly normal pressure hydrocephalus or neoplasm.  Will screen with CT head.  Will also chest x-ray to exclude pneumonia.  Chest x-ray unremarkable, CT head normal.  Patient feeling well, ambulating in the ED without issue.  Presumed continued viral illness, advised plenty of  fluids at home, PCP follow-up.  After the discussed management above, the patient was determined to be safe for discharge.  The patient was in agreement with this plan and all questions regarding their care were answered.  ED return precautions were discussed and the patient will return to the ED with any significant worsening of condition.  Elmer Sow. Pilar Plate, MD [facility] Emergency Medicine [facility] [email or web address]  Final Clinical  Impressions(s) / ED Diagnoses     ICD-10-CM   1. Viral illness B34.9   2. Cough R05 DG Chest 2 View    DG Chest 2 View  3. Bronchitis J40     ED Discharge Orders    None         Sabas Sous, MD 11/03/18 [phone]

## 2018-11-09 DIAGNOSIS — J441 Chronic obstructive pulmonary disease with (acute) exacerbation: Secondary | ICD-10-CM | POA: Diagnosis not present

## 2018-11-09 DIAGNOSIS — J181 Lobar pneumonia, unspecified organism: Secondary | ICD-10-CM | POA: Diagnosis not present

## 2018-11-09 DIAGNOSIS — R05 Cough: Secondary | ICD-10-CM | POA: Diagnosis not present

## 2018-11-18 DIAGNOSIS — R079 Chest pain, unspecified: Secondary | ICD-10-CM | POA: Diagnosis not present

## 2018-11-18 DIAGNOSIS — I493 Ventricular premature depolarization: Secondary | ICD-10-CM | POA: Diagnosis not present

## 2018-11-18 DIAGNOSIS — J181 Lobar pneumonia, unspecified organism: Secondary | ICD-10-CM | POA: Diagnosis not present

## 2018-11-18 DIAGNOSIS — J441 Chronic obstructive pulmonary disease with (acute) exacerbation: Secondary | ICD-10-CM | POA: Diagnosis not present

## 2018-11-18 DIAGNOSIS — J9809 Other diseases of bronchus, not elsewhere classified: Secondary | ICD-10-CM | POA: Diagnosis not present

## 2018-11-18 DIAGNOSIS — M940 Chondrocostal junction syndrome [Tietze]: Secondary | ICD-10-CM | POA: Diagnosis not present

## 2018-11-18 DIAGNOSIS — R05 Cough: Secondary | ICD-10-CM | POA: Diagnosis not present

## 2018-11-18 DIAGNOSIS — R0602 Shortness of breath: Secondary | ICD-10-CM | POA: Diagnosis not present

## 2018-11-18 DIAGNOSIS — M069 Rheumatoid arthritis, unspecified: Secondary | ICD-10-CM | POA: Diagnosis not present

## 2018-11-18 DIAGNOSIS — F17210 Nicotine dependence, cigarettes, uncomplicated: Secondary | ICD-10-CM | POA: Diagnosis not present

## 2018-11-19 DIAGNOSIS — I493 Ventricular premature depolarization: Secondary | ICD-10-CM | POA: Diagnosis not present

## 2018-11-24 ENCOUNTER — Emergency Department (HOSPITAL_BASED_OUTPATIENT_CLINIC_OR_DEPARTMENT_OTHER): Payer: Medicare HMO

## 2018-11-24 ENCOUNTER — Emergency Department (HOSPITAL_BASED_OUTPATIENT_CLINIC_OR_DEPARTMENT_OTHER)
Admission: EM | Admit: 2018-11-24 | Discharge: 2018-11-25 | Disposition: A | Discharge status: Home / Self Care | Payer: Medicare HMO | Attending: Emergency Medicine | Admitting: Emergency Medicine

## 2018-11-24 ENCOUNTER — Encounter (HOSPITAL_BASED_OUTPATIENT_CLINIC_OR_DEPARTMENT_OTHER): Payer: Self-pay | Admitting: *Deleted

## 2018-11-24 ENCOUNTER — Other Ambulatory Visit: Payer: Self-pay

## 2018-11-24 DIAGNOSIS — R0602 Shortness of breath: Secondary | ICD-10-CM | POA: Diagnosis not present

## 2018-11-24 DIAGNOSIS — R079 Chest pain, unspecified: Secondary | ICD-10-CM | POA: Diagnosis not present

## 2018-11-24 DIAGNOSIS — J449 Chronic obstructive pulmonary disease, unspecified: Secondary | ICD-10-CM | POA: Insufficient documentation

## 2018-11-24 DIAGNOSIS — E119 Type 2 diabetes mellitus without complications: Secondary | ICD-10-CM | POA: Diagnosis not present

## 2018-11-24 DIAGNOSIS — Z79899 Other long term (current) drug therapy: Secondary | ICD-10-CM | POA: Diagnosis not present

## 2018-11-24 DIAGNOSIS — F17210 Nicotine dependence, cigarettes, uncomplicated: Secondary | ICD-10-CM | POA: Diagnosis not present

## 2018-11-24 DIAGNOSIS — R05 Cough: Secondary | ICD-10-CM

## 2018-11-24 DIAGNOSIS — Z7902 Long term (current) use of antithrombotics/antiplatelets: Secondary | ICD-10-CM | POA: Diagnosis not present

## 2018-11-24 DIAGNOSIS — R059 Cough, unspecified: Secondary | ICD-10-CM

## 2018-11-24 DIAGNOSIS — Z794 Long term (current) use of insulin: Secondary | ICD-10-CM | POA: Insufficient documentation

## 2018-11-24 MED ORDER — IPRATROPIUM-ALBUTEROL 0.5-2.5 (3) MG/3ML IN SOLN
3.0000 mL | Freq: Four times a day (QID) | RESPIRATORY_TRACT | Status: DC
  Administered 2018-11-24: 3 mL via RESPIRATORY_TRACT
  Filled 2018-11-24: qty 3

## 2018-11-24 MED ORDER — SODIUM CHLORIDE 0.9 % IV BOLUS
500.0000 mL | Freq: Once | INTRAVENOUS | Status: AC
  Administered 2018-11-24: 500 mL via INTRAVENOUS

## 2018-11-24 MED ORDER — KETOROLAC TROMETHAMINE 15 MG/ML IJ SOLN
15.0000 mg | Freq: Once | INTRAMUSCULAR | Status: AC
  Administered 2018-11-24: 15 mg via INTRAVENOUS
  Filled 2018-11-24: qty 1

## 2018-11-24 NOTE — ED Notes (Signed)
Beverage and crackers provided. 

## 2018-11-24 NOTE — ED Notes (Signed)
Beverage provided.

## 2018-11-24 NOTE — ED Provider Notes (Signed)
[facility] EMERGENCY DEPARTMENT Provider Note   CSN: [id number] Arrival date & time: 11/24/18  2041     History   Chief Complaint Chief Complaint  Patient presents with  . Cough    HPI Amy Montoya is a 58 y.o. female presenting for evaluation of cough.  Patient states that the past weeks, she has been having persistent shortness of breath and cough.  She reports intermittent fevers and generalized body aches.  Patient states she was evaluated in December, where she was found to have pneumonia.  She was given Levaquin, prednisone, and Tessalon Perles.  Patient states since then, symptoms have not improved.  She reports a history of COPD, uses pro-air as needed, usually around twice a day.  She smokes half a pack to a full pack of cigarettes a day.  Patient reports pain of her left chest when coughing, no pain at rest.  She denies nausea, vomiting, abdominal pain, urinary symptoms, normal bowel movements.  Patient denies recent travel, surgeries, immobilization, history of cancer, history of previous DVT/PE, or ocp use.  Blood sugar has been at her baseline between 150 and 250.  Additional history obtained from chart review.  Patient has been seen times previously for her symptoms.  6 days ago, she was seen at [facility] where she had a reassuring cardiac work-up, noting negative serial troponins and reassuring EKG.  HPI  Past Medical History:  Diagnosis Date  . Arthritis   . Chronic pain   . COPD (chronic obstructive pulmonary disease) (HCC)   . Diabetes mellitus   . Neuropathy     There are no active problems to display for this patient.   Past Surgical History:  Procedure Laterality Date  . ANKLE SURGERY    . CESAREAN SECTION    . TONSILLECTOMY       OB History   No obstetric history on file.      Home Medications    Prior to Admission medications   Medication Sig Start Date End Date Taking? Authorizing Provider  benzonatate (TESSALON) 100  MG capsule Take 1 capsule (100 mg total) by mouth every 8 (eight) hours. 01/06/17  Yes Deborha Payment, PA-C  Buprenorphine HCl-Naloxone HCl 8-2 MG FILM Place under the tongue.   Yes [provider]  Buprenorphine HCl-Naloxone HCl 8-2 MG FILM Place under the tongue.   Yes [provider]  carisoprodol (SOMA) 350 MG tablet Take by mouth. 12/11/16  Yes [provider]  Cholecalciferol (VITAMIN D3) 25 MCG (1000 UT) CAPS Take by mouth.   Yes [provider]  Dulaglutide (TRULICITY) 0.75 MG/0.5ML SOPN  07/10/18  Yes [provider]  insulin detemir (LEVEMIR) 100 UNIT/ML injection Inject 30 Units into the skin every morning.   Yes [provider]  metFORMIN (GLUCOPHAGE) 1000 MG tablet Take 1,000 mg by mouth 2 (two) times daily with a meal.   Yes [provider]  metFORMIN (GLUCOPHAGE) 1000 MG tablet Take by mouth. 12/15/15  Yes [provider]  pravastatin (PRAVACHOL) 10 MG tablet Take 10 mg by mouth daily.   Yes [provider]  pravastatin (PRAVACHOL) 10 MG tablet Take by mouth.   Yes [provider]  Pregabalin (LYRICA PO) Take by mouth.   Yes [provider]  busPIRone (BUSPAR) 10 MG tablet  07/10/18   [provider]  clonazePAM (KLONOPIN) 1 MG tablet Take 1 mg by mouth 2 (two) times daily as needed for anxiety.    [provider]  ezetimibe-simvastatin (VYTORIN) 10-80 MG per tablet Take 1 tablet by mouth at bedtime.    [provider]  gabapentin (NEURONTIN) 800 MG tablet Take 800 mg by mouth 4 (four) times daily.    [provider]  hydrOXYzine (ATARAX/VISTARIL) 25 MG tablet  07/10/18   [provider]  ketorolac (TORADOL) 10 MG tablet Take 1 tablet (10 mg total) by mouth every 8 (eight) hours as needed for severe pain. 11/25/18   Solaris Kram, PA-C  oseltamivir (TAMIFLU) 75 MG capsule Take 1 capsule (75 mg total) by mouth every 12 (twelve) hours. 01/06/17    Deborha Payment, PA-C  sertraline (ZOLOFT) 100 MG tablet Take 100 mg by mouth daily.    [provider]    Family History No family history on file.  Social History Social History   Tobacco Use  . Smoking status: Current Every Day Smoker    Packs/day: 1.00    Types: Cigarettes  . Smokeless tobacco: Never Used  Substance Use Topics  . Alcohol use: No  . Drug use: No     Allergies   Naproxen and Penicillins   Review of Systems Review of Systems  Respiratory: Positive for cough.   Cardiovascular: Positive for chest pain (With coughing).  All other systems reviewed and are negative.    Physical Exam Updated Vital Signs BP 129/71 (BP Location: Left Arm)   Pulse 87   Temp 98.7 F (37.1 C) (Oral)   Resp 16   Ht 5\' 7"  (1.702 m)   Wt 59 kg   SpO2 100%   BMI 20.36 kg/m   Physical Exam Vitals signs and nursing note reviewed.  Constitutional:      General: She is not in acute distress.    Appearance: She is well-developed.     Comments: Appears nontoxic  HENT:     Head: Normocephalic and atraumatic.     Mouth/Throat:     Mouth: Mucous membranes are dry.     Comments: MM dry Eyes:     Conjunctiva/sclera: Conjunctivae normal.     Pupils: Pupils are equal, round, and reactive to light.  Neck:     Musculoskeletal: Normal range of motion and neck supple.  Cardiovascular:     Rate and Rhythm: Normal rate and regular rhythm.     Pulses: Normal pulses.  Pulmonary:     Effort: Pulmonary effort is normal. No respiratory distress.     Breath sounds: Normal breath sounds. No wheezing.     Comments: Speaking in full sentences.  Clear lung sounds in all fields. Tenderness palpation of left-sided chest wall. Chest:     Chest wall: Tenderness present.  Abdominal:     General: There is no distension.     Palpations: Abdomen is soft. There is no mass.     Tenderness: There is no abdominal tenderness. There is no guarding or rebound.  Musculoskeletal: Normal  range of motion.     Comments: No leg pain or swelling  Skin:    General: Skin is warm and dry.     Capillary Refill: Capillary refill takes less than 2 seconds.  Neurological:     Mental Status: She is alert and oriented to person, place, and time.      ED Treatments / Results  Labs (all labs ordered are listed, but only abnormal results are displayed) Labs Reviewed - No data to display  EKG None  Radiology Dg Chest 2 View  Result Date: 11/24/2018 CLINICAL DATA:  Cough for 3 weeks. Pneumonia 3 weeks ago. No improvement. History of hypertension, diabetes, emphysema. EXAM: CHEST - 2 VIEW COMPARISON:  11/18/2018 FINDINGS: Normal heart size and pulmonary vascularity. No focal airspace disease or consolidation in the lungs. No blunting of costophrenic angles. No pneumothorax. Mediastinal contours appear intact. Degenerative changes in the spine. IMPRESSION: No active cardiopulmonary disease. Electronically Signed   By: Burman NievesWilliam  Stevens M.D.   On: 11/24/2018 21:45    Procedures Procedures (including critical care time)  Medications Ordered in ED Medications  ipratropium-albuterol (DUONEB) 0.5-2.5 (3) MG/3ML nebulizer solution 3 mL (3 mLs Nebulization Given 11/24/18 2313)  ketorolac (TORADOL) 15 MG/ML injection 15 mg (15 mg Intravenous Given 11/24/18 2319)  sodium chloride 0.9 % bolus 500 mL (0 mLs Intravenous Stopped 11/25/18 0020)     Initial Impression / Assessment and Plan / ED Course  I have reviewed the triage vital signs and the nursing notes.  Pertinent labs & imaging results that were available during my care of the patient were reviewed by me and considered in my medical decision making (see chart for details).     Senting for evaluation of 3-week history of cough.  Physical exam reassuring, she is afebrile not tachycardic.  Appears nontoxic.  Patient has chest pain when coughing, no pain at rest.  Pain is reproducible with palpation of the musculature.  Had recent  reassuring cardiac work-up at the same symptoms.  Thus, low suspicion for ACS.  Reviewed interpreted by me, no pneumonia, no thorax, effusion, or cardiomegaly.  Likely MSK pain related to coughing.  Patient ambulated around the ER without signs of distress.  Able to speak in full sentences while ambulating.  Pulse ox remained stable at 99%.  Heart rate jumped slightly to upper 90s while ambulating.  Patient's cough likely related to her COPD and continuation of smoking.  Will give breathing treatment, give Toradol, and reassess.  Additionally, patient appears mildly dehydrated, rate increased with ambulation.  As such, will give fluids.  On reassessment, patient reports pain is improved.  She has not been coughing as much since the breathing treatment.  Low suspicion for PE at this time, no risk factors, she is not tachycardic or hypoxic.  Discussed findings with patient.  Discussed likely MSK pain due to her continued cough.  Discussed importance of increasing her albuterol use for the next 2 days, and close follow-up with her PCP.  Will give small dose of Toradol for pain control.  Encourage smoking cessation.  At this time, patient appears safe to discharge.  Return precautions given.  Patient states she understands and agrees to plan.   Final Clinical Impressions(s) / ED Diagnoses   Final diagnoses:  Cough    ED Discharge Orders         Ordered    ketorolac (TORADOL) 10 MG tablet  Every 8 hours PRN     11/25/18 0005           Nekeya Briski, PA-C 11/25/18 0102    Doug SouJacubowitz, Sam, MD 11/25/18 1052

## 2018-11-24 NOTE — ED Notes (Signed)
ED Provider at bedside. 

## 2018-11-24 NOTE — ED Triage Notes (Signed)
Cough x 3 weeks. Dx with pneumonia 3 weeks ago and treated with levaquin and steroids. States she is not feeling better.

## 2018-11-24 NOTE — ED Notes (Signed)
Per PA please discharge patient after IV fluid completion.

## 2018-11-25 DIAGNOSIS — Z79891 Long term (current) use of opiate analgesic: Secondary | ICD-10-CM | POA: Diagnosis not present

## 2018-11-25 DIAGNOSIS — M545 Low back pain: Secondary | ICD-10-CM | POA: Diagnosis not present

## 2018-11-25 DIAGNOSIS — G894 Chronic pain syndrome: Secondary | ICD-10-CM | POA: Diagnosis not present

## 2018-11-25 MED ORDER — KETOROLAC TROMETHAMINE 10 MG PO TABS: 10.0000 mg | ORAL_TABLET | Freq: Three times a day (TID) | ORAL | 0 refills | Status: AC | PRN

## 2018-11-25 NOTE — Discharge Instructions (Addendum)
Use your inhaler every 4 hours for the next 2 days.  After that, use only as needed for shortness of breath or chest tightness. It is very important that she start eating regular meals and drinking water throughout the day. Continue taking home medications as prescribed. Use toradol as needed for severe pain.  Follow up with your primary care doctor for further evaluation of your symptoms.  Return to the ER with any new, worsening, or concerning symptoms.

## 2018-12-23 DIAGNOSIS — G894 Chronic pain syndrome: Secondary | ICD-10-CM | POA: Diagnosis not present

## 2018-12-23 DIAGNOSIS — Z79891 Long term (current) use of opiate analgesic: Secondary | ICD-10-CM | POA: Diagnosis not present

## 2018-12-23 DIAGNOSIS — M545 Low back pain: Secondary | ICD-10-CM | POA: Diagnosis not present

## 2019-01-15 DIAGNOSIS — G894 Chronic pain syndrome: Secondary | ICD-10-CM | POA: Diagnosis not present

## 2019-01-15 DIAGNOSIS — Z79891 Long term (current) use of opiate analgesic: Secondary | ICD-10-CM | POA: Diagnosis not present

## 2019-01-15 DIAGNOSIS — M545 Low back pain: Secondary | ICD-10-CM | POA: Diagnosis not present

## 2019-02-12 DIAGNOSIS — Z79891 Long term (current) use of opiate analgesic: Secondary | ICD-10-CM | POA: Diagnosis not present

## 2019-02-12 DIAGNOSIS — M545 Low back pain: Secondary | ICD-10-CM | POA: Diagnosis not present

## 2019-02-12 DIAGNOSIS — G894 Chronic pain syndrome: Secondary | ICD-10-CM | POA: Diagnosis not present

## 2019-03-12 DIAGNOSIS — G894 Chronic pain syndrome: Secondary | ICD-10-CM | POA: Diagnosis not present

## 2019-03-12 DIAGNOSIS — M545 Low back pain: Secondary | ICD-10-CM | POA: Diagnosis not present

## 2019-03-12 DIAGNOSIS — Z79891 Long term (current) use of opiate analgesic: Secondary | ICD-10-CM | POA: Diagnosis not present

## 2019-04-10 DIAGNOSIS — G894 Chronic pain syndrome: Secondary | ICD-10-CM | POA: Diagnosis not present

## 2019-04-10 DIAGNOSIS — Z79891 Long term (current) use of opiate analgesic: Secondary | ICD-10-CM | POA: Diagnosis not present

## 2019-04-10 DIAGNOSIS — M545 Low back pain: Secondary | ICD-10-CM | POA: Diagnosis not present

## 2019-04-11 DIAGNOSIS — E559 Vitamin D deficiency, unspecified: Secondary | ICD-10-CM | POA: Diagnosis not present

## 2019-04-11 DIAGNOSIS — E1140 Type 2 diabetes mellitus with diabetic neuropathy, unspecified: Secondary | ICD-10-CM | POA: Diagnosis not present

## 2019-04-11 DIAGNOSIS — E785 Hyperlipidemia, unspecified: Secondary | ICD-10-CM | POA: Diagnosis not present

## 2019-04-11 DIAGNOSIS — F418 Other specified anxiety disorders: Secondary | ICD-10-CM | POA: Diagnosis not present

## 2019-04-11 DIAGNOSIS — Z0000 Encounter for general adult medical examination without abnormal findings: Secondary | ICD-10-CM | POA: Diagnosis not present

## 2019-04-11 DIAGNOSIS — J449 Chronic obstructive pulmonary disease, unspecified: Secondary | ICD-10-CM | POA: Diagnosis not present

## 2019-04-11 DIAGNOSIS — F17210 Nicotine dependence, cigarettes, uncomplicated: Secondary | ICD-10-CM | POA: Diagnosis not present

## 2019-04-11 DIAGNOSIS — I10 Essential (primary) hypertension: Secondary | ICD-10-CM | POA: Diagnosis not present

## 2019-04-11 DIAGNOSIS — Z136 Encounter for screening for cardiovascular disorders: Secondary | ICD-10-CM | POA: Diagnosis not present

## 2019-05-08 DIAGNOSIS — Z79891 Long term (current) use of opiate analgesic: Secondary | ICD-10-CM | POA: Diagnosis not present

## 2019-05-08 DIAGNOSIS — M545 Low back pain: Secondary | ICD-10-CM | POA: Diagnosis not present

## 2019-05-08 DIAGNOSIS — G894 Chronic pain syndrome: Secondary | ICD-10-CM | POA: Diagnosis not present

## 2019-06-05 DIAGNOSIS — G894 Chronic pain syndrome: Secondary | ICD-10-CM | POA: Diagnosis not present

## 2019-06-05 DIAGNOSIS — M545 Low back pain: Secondary | ICD-10-CM | POA: Diagnosis not present

## 2019-06-05 DIAGNOSIS — Z79891 Long term (current) use of opiate analgesic: Secondary | ICD-10-CM | POA: Diagnosis not present

## 2019-07-03 DIAGNOSIS — Z79891 Long term (current) use of opiate analgesic: Secondary | ICD-10-CM | POA: Diagnosis not present

## 2019-07-03 DIAGNOSIS — G894 Chronic pain syndrome: Secondary | ICD-10-CM | POA: Diagnosis not present

## 2019-07-03 DIAGNOSIS — M545 Low back pain: Secondary | ICD-10-CM | POA: Diagnosis not present

## 2019-07-07 DIAGNOSIS — S62102A Fracture of unspecified carpal bone, left wrist, initial encounter for closed fracture: Secondary | ICD-10-CM | POA: Diagnosis not present

## 2019-07-07 DIAGNOSIS — M79642 Pain in left hand: Secondary | ICD-10-CM | POA: Diagnosis not present

## 2019-07-17 DIAGNOSIS — S62102A Fracture of unspecified carpal bone, left wrist, initial encounter for closed fracture: Secondary | ICD-10-CM | POA: Diagnosis not present

## 2019-07-26 IMAGING — CR DG CHEST 2V
2 series · 2 of 2 positions shown · non-contrast
Comparison: 11/18/2018

CLINICAL DATA: Cough for 3 weeks. Pneumonia 3 weeks ago. No
improvement. History of hypertension, diabetes, emphysema.

EXAM:
CHEST - 2 VIEW

[w chest pa]
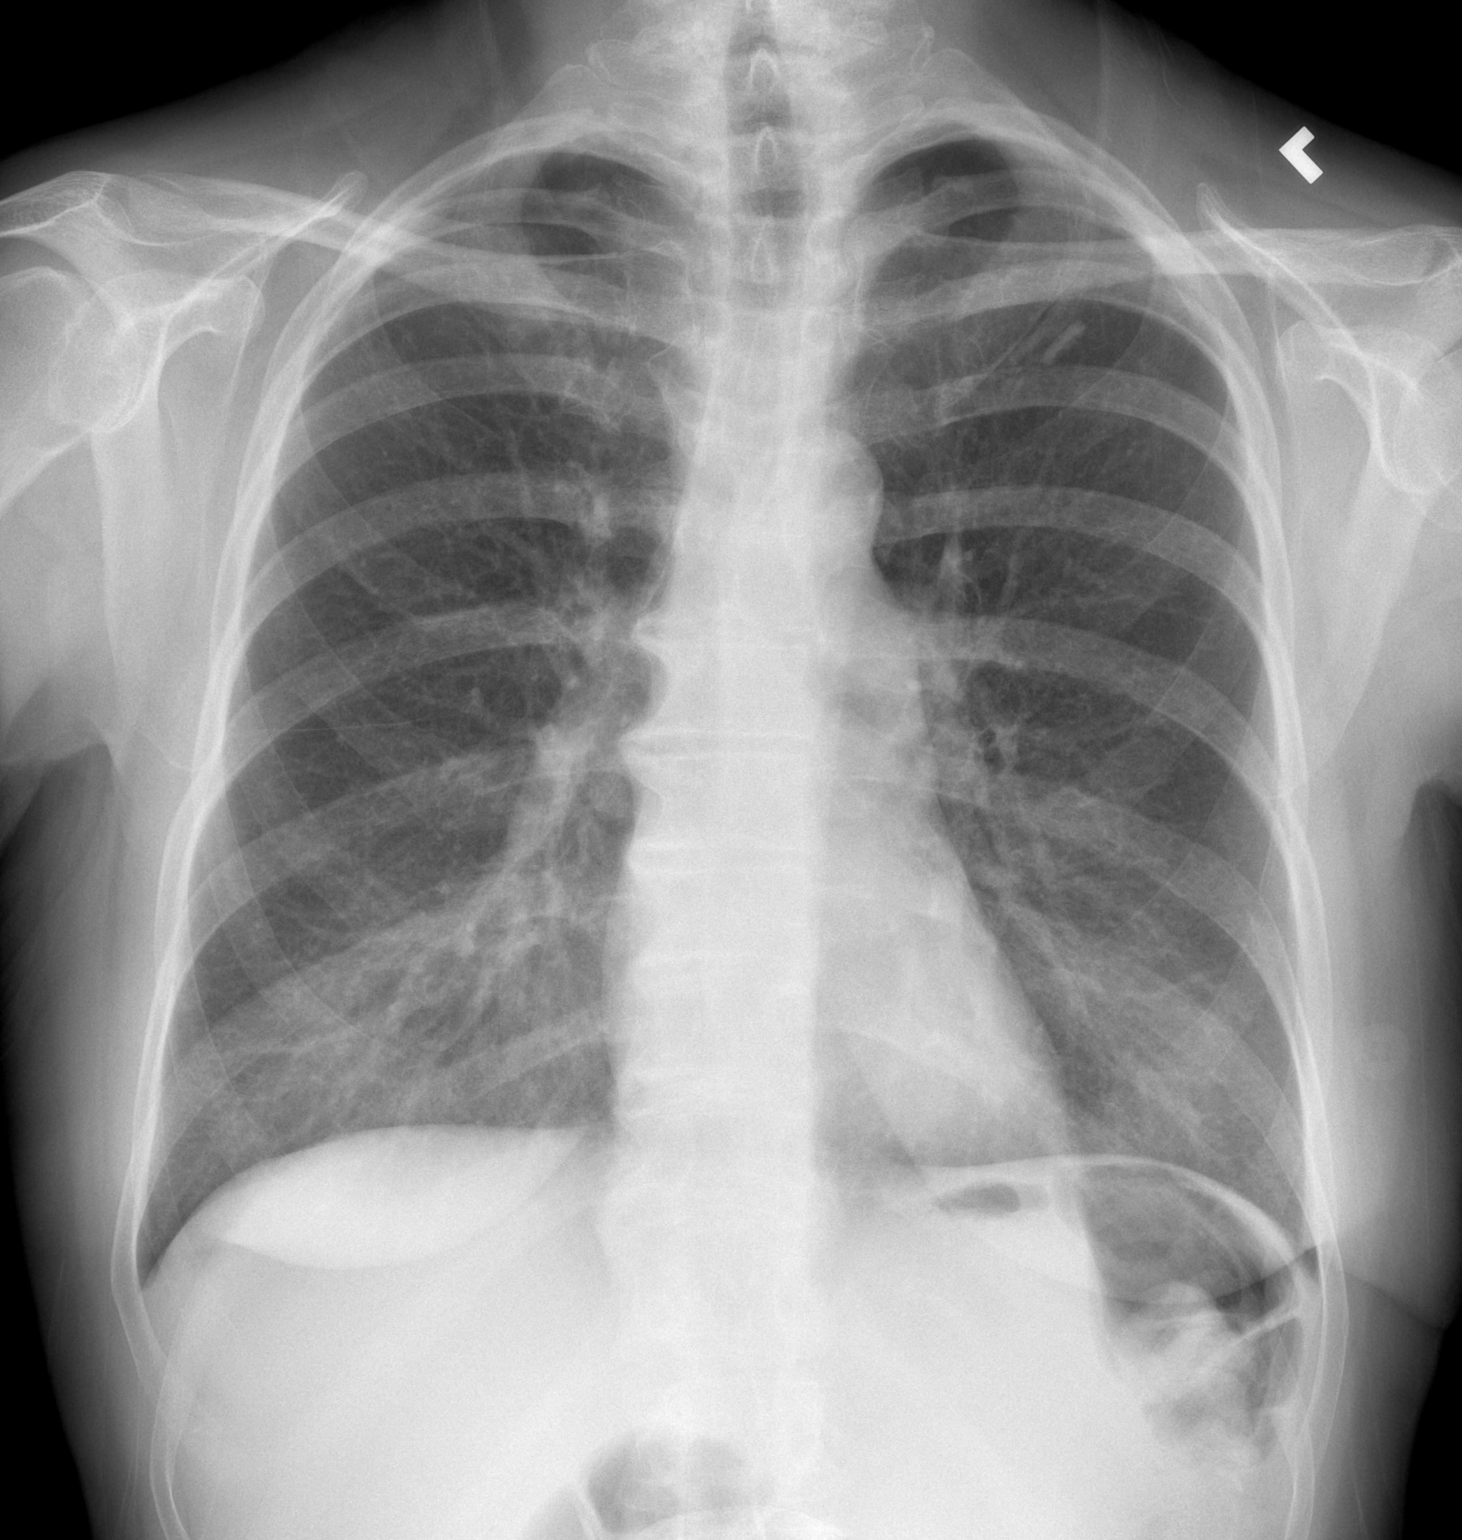

[w chest lat]
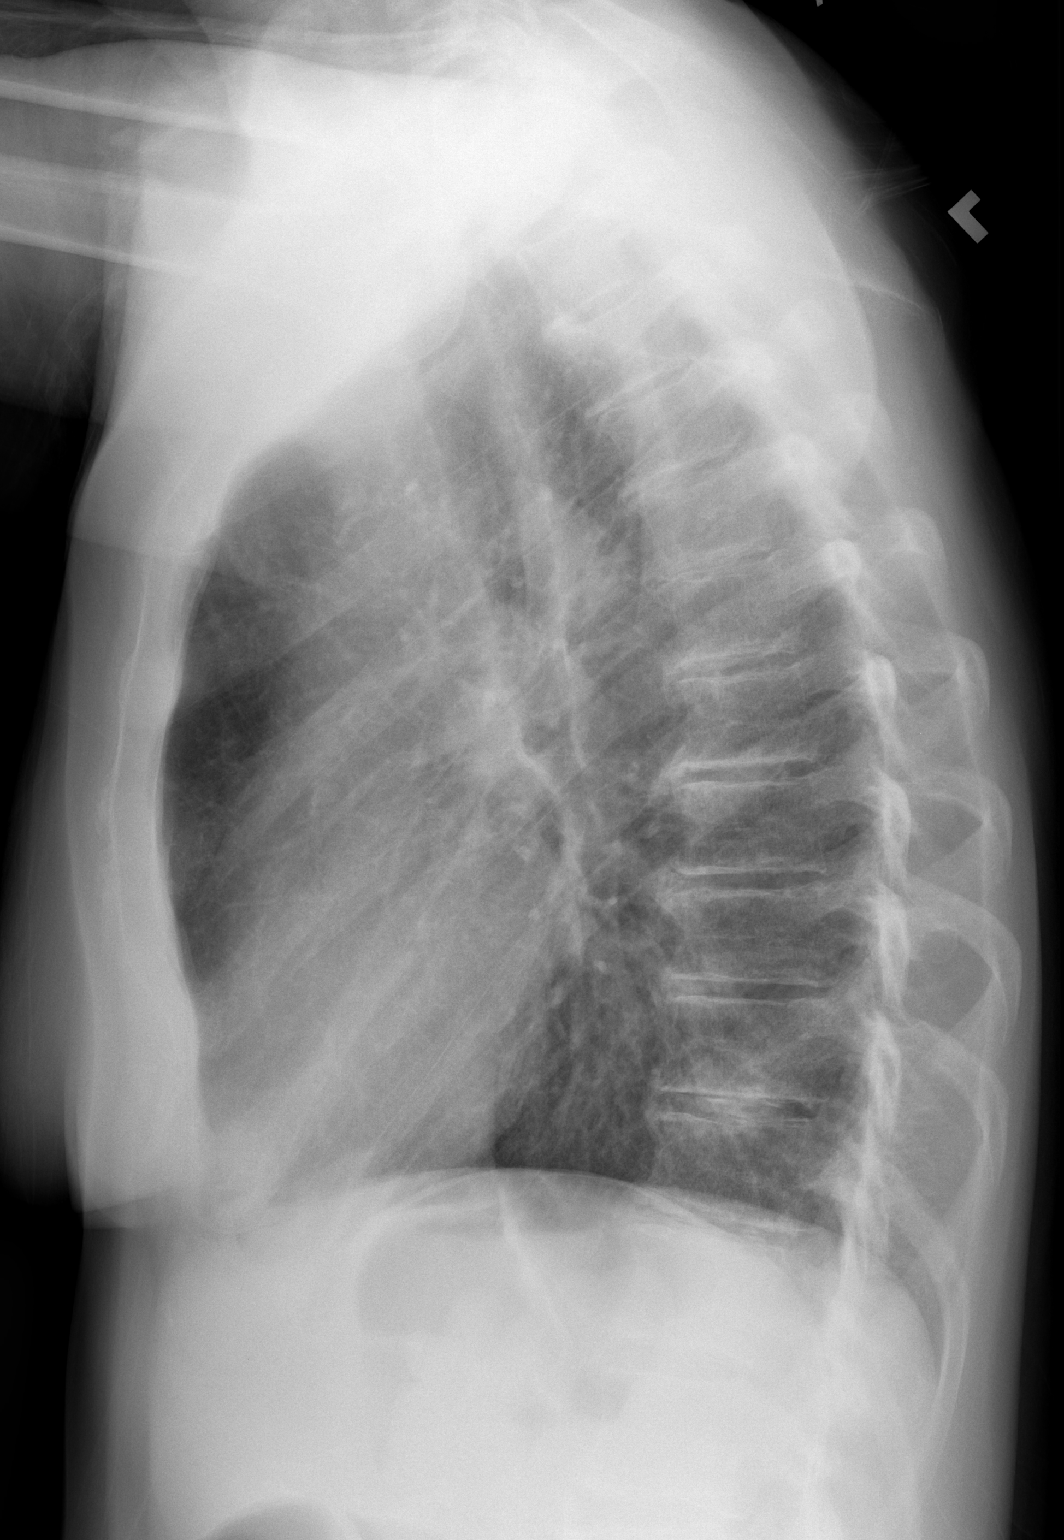

[2 of 2 positions shown; findings below may reference images not displayed]

FINDINGS: Normal heart size and pulmonary vascularity. No focal airspace
disease or consolidation in the lungs. No blunting of costophrenic
angles. No pneumothorax. Mediastinal contours appear intact.
Degenerative changes in the spine.
IMPRESSION: No active cardiopulmonary disease.

## 2019-07-29 DIAGNOSIS — S62102D Fracture of unspecified carpal bone, left wrist, subsequent encounter for fracture with routine healing: Secondary | ICD-10-CM | POA: Diagnosis not present

## 2019-08-01 DIAGNOSIS — G894 Chronic pain syndrome: Secondary | ICD-10-CM | POA: Diagnosis not present

## 2019-08-01 DIAGNOSIS — M545 Low back pain: Secondary | ICD-10-CM | POA: Diagnosis not present

## 2019-08-01 DIAGNOSIS — Z79891 Long term (current) use of opiate analgesic: Secondary | ICD-10-CM | POA: Diagnosis not present

## 2019-08-25 DIAGNOSIS — M25532 Pain in left wrist: Secondary | ICD-10-CM | POA: Diagnosis not present

## 2019-08-25 DIAGNOSIS — S62102D Fracture of unspecified carpal bone, left wrist, subsequent encounter for fracture with routine healing: Secondary | ICD-10-CM | POA: Diagnosis not present

## 2019-08-27 DIAGNOSIS — M545 Low back pain: Secondary | ICD-10-CM | POA: Diagnosis not present

## 2019-08-27 DIAGNOSIS — G894 Chronic pain syndrome: Secondary | ICD-10-CM | POA: Diagnosis not present

## 2019-08-27 DIAGNOSIS — Z79891 Long term (current) use of opiate analgesic: Secondary | ICD-10-CM | POA: Diagnosis not present

## 2019-09-24 DIAGNOSIS — G894 Chronic pain syndrome: Secondary | ICD-10-CM | POA: Diagnosis not present

## 2019-09-24 DIAGNOSIS — M545 Low back pain: Secondary | ICD-10-CM | POA: Diagnosis not present

## 2019-09-24 DIAGNOSIS — Z79891 Long term (current) use of opiate analgesic: Secondary | ICD-10-CM | POA: Diagnosis not present

## 2019-09-30 DIAGNOSIS — Z20828 Contact with and (suspected) exposure to other viral communicable diseases: Secondary | ICD-10-CM | POA: Diagnosis not present

## 2019-09-30 DIAGNOSIS — R52 Pain, unspecified: Secondary | ICD-10-CM | POA: Diagnosis not present

## 2019-09-30 DIAGNOSIS — Y999 Unspecified external cause status: Secondary | ICD-10-CM | POA: Diagnosis not present

## 2019-09-30 DIAGNOSIS — R531 Weakness: Secondary | ICD-10-CM | POA: Diagnosis not present

## 2019-09-30 DIAGNOSIS — S42492A Other displaced fracture of lower end of left humerus, initial encounter for closed fracture: Secondary | ICD-10-CM | POA: Diagnosis not present

## 2019-09-30 DIAGNOSIS — R404 Transient alteration of awareness: Secondary | ICD-10-CM | POA: Diagnosis not present

## 2019-09-30 DIAGNOSIS — S0990XA Unspecified injury of head, initial encounter: Secondary | ICD-10-CM | POA: Diagnosis not present

## 2019-09-30 DIAGNOSIS — S42412A Displaced simple supracondylar fracture without intercondylar fracture of left humerus, initial encounter for closed fracture: Secondary | ICD-10-CM | POA: Diagnosis not present

## 2019-09-30 DIAGNOSIS — R4182 Altered mental status, unspecified: Secondary | ICD-10-CM | POA: Diagnosis not present

## 2019-09-30 DIAGNOSIS — E119 Type 2 diabetes mellitus without complications: Secondary | ICD-10-CM | POA: Diagnosis not present

## 2019-09-30 DIAGNOSIS — W108XXA Fall (on) (from) other stairs and steps, initial encounter: Secondary | ICD-10-CM | POA: Diagnosis not present

## 2019-09-30 DIAGNOSIS — I959 Hypotension, unspecified: Secondary | ICD-10-CM | POA: Diagnosis not present

## 2019-09-30 DIAGNOSIS — S42422A Displaced comminuted supracondylar fracture without intercondylar fracture of left humerus, initial encounter for closed fracture: Secondary | ICD-10-CM | POA: Diagnosis not present

## 2019-09-30 DIAGNOSIS — M79603 Pain in arm, unspecified: Secondary | ICD-10-CM | POA: Diagnosis not present

## 2019-10-13 DIAGNOSIS — S52022G Displaced fracture of olecranon process without intraarticular extension of left ulna, subsequent encounter for closed fracture with delayed healing: Secondary | ICD-10-CM | POA: Diagnosis not present

## 2019-10-13 DIAGNOSIS — Z01812 Encounter for preprocedural laboratory examination: Secondary | ICD-10-CM | POA: Diagnosis not present

## 2019-10-13 DIAGNOSIS — S52202A Unspecified fracture of shaft of left ulna, initial encounter for closed fracture: Secondary | ICD-10-CM | POA: Diagnosis not present

## 2019-10-13 DIAGNOSIS — Z20828 Contact with and (suspected) exposure to other viral communicable diseases: Secondary | ICD-10-CM | POA: Diagnosis not present

## 2019-10-13 DIAGNOSIS — S42412A Displaced simple supracondylar fracture without intercondylar fracture of left humerus, initial encounter for closed fracture: Secondary | ICD-10-CM | POA: Diagnosis not present

## 2019-10-16 DIAGNOSIS — S52022A Displaced fracture of olecranon process without intraarticular extension of left ulna, initial encounter for closed fracture: Secondary | ICD-10-CM | POA: Diagnosis not present

## 2019-10-16 DIAGNOSIS — S52022G Displaced fracture of olecranon process without intraarticular extension of left ulna, subsequent encounter for closed fracture with delayed healing: Secondary | ICD-10-CM | POA: Diagnosis not present

## 2019-10-24 DIAGNOSIS — M545 Low back pain: Secondary | ICD-10-CM | POA: Diagnosis not present

## 2019-10-24 DIAGNOSIS — Z79891 Long term (current) use of opiate analgesic: Secondary | ICD-10-CM | POA: Diagnosis not present

## 2019-10-24 DIAGNOSIS — G894 Chronic pain syndrome: Secondary | ICD-10-CM | POA: Diagnosis not present

## 2019-10-28 DIAGNOSIS — S52022G Displaced fracture of olecranon process without intraarticular extension of left ulna, subsequent encounter for closed fracture with delayed healing: Secondary | ICD-10-CM | POA: Diagnosis not present

## 2019-11-24 DIAGNOSIS — Z79891 Long term (current) use of opiate analgesic: Secondary | ICD-10-CM | POA: Diagnosis not present

## 2019-11-24 DIAGNOSIS — G894 Chronic pain syndrome: Secondary | ICD-10-CM | POA: Diagnosis not present

## 2019-11-24 DIAGNOSIS — M545 Low back pain: Secondary | ICD-10-CM | POA: Diagnosis not present

## 2019-11-27 DIAGNOSIS — Z4789 Encounter for other orthopedic aftercare: Secondary | ICD-10-CM | POA: Diagnosis not present

## 2019-11-27 DIAGNOSIS — M069 Rheumatoid arthritis, unspecified: Secondary | ICD-10-CM | POA: Diagnosis not present

## 2019-12-24 DIAGNOSIS — G894 Chronic pain syndrome: Secondary | ICD-10-CM | POA: Diagnosis not present

## 2019-12-24 DIAGNOSIS — Z79891 Long term (current) use of opiate analgesic: Secondary | ICD-10-CM | POA: Diagnosis not present

## 2019-12-24 DIAGNOSIS — M545 Low back pain: Secondary | ICD-10-CM | POA: Diagnosis not present

## 2020-01-20 DIAGNOSIS — F17210 Nicotine dependence, cigarettes, uncomplicated: Secondary | ICD-10-CM | POA: Diagnosis not present

## 2020-01-20 DIAGNOSIS — I10 Essential (primary) hypertension: Secondary | ICD-10-CM | POA: Diagnosis not present

## 2020-01-20 DIAGNOSIS — E559 Vitamin D deficiency, unspecified: Secondary | ICD-10-CM | POA: Diagnosis not present

## 2020-01-20 DIAGNOSIS — F418 Other specified anxiety disorders: Secondary | ICD-10-CM | POA: Diagnosis not present

## 2020-01-20 DIAGNOSIS — E1140 Type 2 diabetes mellitus with diabetic neuropathy, unspecified: Secondary | ICD-10-CM | POA: Diagnosis not present

## 2020-01-20 DIAGNOSIS — G894 Chronic pain syndrome: Secondary | ICD-10-CM | POA: Diagnosis not present

## 2020-01-20 DIAGNOSIS — D649 Anemia, unspecified: Secondary | ICD-10-CM | POA: Diagnosis not present

## 2020-01-20 DIAGNOSIS — E785 Hyperlipidemia, unspecified: Secondary | ICD-10-CM | POA: Diagnosis not present

## 2020-01-20 DIAGNOSIS — J449 Chronic obstructive pulmonary disease, unspecified: Secondary | ICD-10-CM | POA: Diagnosis not present

## 2020-01-21 DIAGNOSIS — M545 Low back pain: Secondary | ICD-10-CM | POA: Diagnosis not present

## 2020-01-21 DIAGNOSIS — G894 Chronic pain syndrome: Secondary | ICD-10-CM | POA: Diagnosis not present

## 2020-01-21 DIAGNOSIS — Z79891 Long term (current) use of opiate analgesic: Secondary | ICD-10-CM | POA: Diagnosis not present

## 2020-02-23 DIAGNOSIS — F1120 Opioid dependence, uncomplicated: Secondary | ICD-10-CM | POA: Diagnosis not present

## 2020-02-23 DIAGNOSIS — M545 Low back pain: Secondary | ICD-10-CM | POA: Diagnosis not present

## 2020-02-23 DIAGNOSIS — G894 Chronic pain syndrome: Secondary | ICD-10-CM | POA: Diagnosis not present

## 2020-02-23 DIAGNOSIS — Z79891 Long term (current) use of opiate analgesic: Secondary | ICD-10-CM | POA: Diagnosis not present

## 2020-03-22 DIAGNOSIS — F1120 Opioid dependence, uncomplicated: Secondary | ICD-10-CM | POA: Diagnosis not present

## 2020-03-22 DIAGNOSIS — G894 Chronic pain syndrome: Secondary | ICD-10-CM | POA: Diagnosis not present

## 2020-03-22 DIAGNOSIS — Z79891 Long term (current) use of opiate analgesic: Secondary | ICD-10-CM | POA: Diagnosis not present

## 2020-03-22 DIAGNOSIS — M545 Low back pain: Secondary | ICD-10-CM | POA: Diagnosis not present

## 2020-04-19 DIAGNOSIS — M545 Low back pain: Secondary | ICD-10-CM | POA: Diagnosis not present

## 2020-04-19 DIAGNOSIS — G894 Chronic pain syndrome: Secondary | ICD-10-CM | POA: Diagnosis not present

## 2020-04-19 DIAGNOSIS — F1120 Opioid dependence, uncomplicated: Secondary | ICD-10-CM | POA: Diagnosis not present

## 2020-05-19 DIAGNOSIS — G894 Chronic pain syndrome: Secondary | ICD-10-CM | POA: Diagnosis not present

## 2020-05-19 DIAGNOSIS — Z79891 Long term (current) use of opiate analgesic: Secondary | ICD-10-CM | POA: Diagnosis not present

## 2020-05-19 DIAGNOSIS — F1120 Opioid dependence, uncomplicated: Secondary | ICD-10-CM | POA: Diagnosis not present

## 2020-05-19 DIAGNOSIS — M545 Low back pain: Secondary | ICD-10-CM | POA: Diagnosis not present

## 2020-06-16 DIAGNOSIS — M545 Low back pain: Secondary | ICD-10-CM | POA: Diagnosis not present

## 2020-06-16 DIAGNOSIS — F1120 Opioid dependence, uncomplicated: Secondary | ICD-10-CM | POA: Diagnosis not present

## 2020-06-16 DIAGNOSIS — G894 Chronic pain syndrome: Secondary | ICD-10-CM | POA: Diagnosis not present

## 2020-06-16 DIAGNOSIS — Z79891 Long term (current) use of opiate analgesic: Secondary | ICD-10-CM | POA: Diagnosis not present

## 2020-07-14 DIAGNOSIS — F1120 Opioid dependence, uncomplicated: Secondary | ICD-10-CM | POA: Diagnosis not present

## 2020-07-20 DIAGNOSIS — E785 Hyperlipidemia, unspecified: Secondary | ICD-10-CM | POA: Diagnosis not present

## 2020-07-20 DIAGNOSIS — G894 Chronic pain syndrome: Secondary | ICD-10-CM | POA: Diagnosis not present

## 2020-07-20 DIAGNOSIS — J00 Acute nasopharyngitis [common cold]: Secondary | ICD-10-CM | POA: Diagnosis not present

## 2020-07-20 DIAGNOSIS — I10 Essential (primary) hypertension: Secondary | ICD-10-CM | POA: Diagnosis not present

## 2020-07-20 DIAGNOSIS — F17210 Nicotine dependence, cigarettes, uncomplicated: Secondary | ICD-10-CM | POA: Diagnosis not present

## 2020-07-20 DIAGNOSIS — F418 Other specified anxiety disorders: Secondary | ICD-10-CM | POA: Diagnosis not present

## 2020-07-20 DIAGNOSIS — E559 Vitamin D deficiency, unspecified: Secondary | ICD-10-CM | POA: Diagnosis not present

## 2020-07-20 DIAGNOSIS — E1140 Type 2 diabetes mellitus with diabetic neuropathy, unspecified: Secondary | ICD-10-CM | POA: Diagnosis not present

## 2020-07-20 DIAGNOSIS — J449 Chronic obstructive pulmonary disease, unspecified: Secondary | ICD-10-CM | POA: Diagnosis not present

## 2020-07-22 DIAGNOSIS — U071 COVID-19: Secondary | ICD-10-CM | POA: Diagnosis not present

## 2020-07-22 DIAGNOSIS — R05 Cough: Secondary | ICD-10-CM | POA: Diagnosis not present

## 2020-07-28 DIAGNOSIS — F1120 Opioid dependence, uncomplicated: Secondary | ICD-10-CM | POA: Diagnosis not present

## 2020-08-10 DIAGNOSIS — L97411 Non-pressure chronic ulcer of right heel and midfoot limited to breakdown of skin: Secondary | ICD-10-CM | POA: Diagnosis not present

## 2020-08-10 DIAGNOSIS — E785 Hyperlipidemia, unspecified: Secondary | ICD-10-CM | POA: Diagnosis not present

## 2020-08-10 DIAGNOSIS — E1140 Type 2 diabetes mellitus with diabetic neuropathy, unspecified: Secondary | ICD-10-CM | POA: Diagnosis not present

## 2020-08-10 DIAGNOSIS — I10 Essential (primary) hypertension: Secondary | ICD-10-CM | POA: Diagnosis not present

## 2020-08-10 DIAGNOSIS — G894 Chronic pain syndrome: Secondary | ICD-10-CM | POA: Diagnosis not present

## 2020-08-10 DIAGNOSIS — J449 Chronic obstructive pulmonary disease, unspecified: Secondary | ICD-10-CM | POA: Diagnosis not present

## 2020-08-10 DIAGNOSIS — F418 Other specified anxiety disorders: Secondary | ICD-10-CM | POA: Diagnosis not present

## 2020-08-10 DIAGNOSIS — F17210 Nicotine dependence, cigarettes, uncomplicated: Secondary | ICD-10-CM | POA: Diagnosis not present

## 2020-08-10 DIAGNOSIS — E559 Vitamin D deficiency, unspecified: Secondary | ICD-10-CM | POA: Diagnosis not present

## 2020-08-20 DIAGNOSIS — F1120 Opioid dependence, uncomplicated: Secondary | ICD-10-CM | POA: Diagnosis not present

## 2020-08-26 DIAGNOSIS — F418 Other specified anxiety disorders: Secondary | ICD-10-CM | POA: Diagnosis not present

## 2020-08-26 DIAGNOSIS — E1140 Type 2 diabetes mellitus with diabetic neuropathy, unspecified: Secondary | ICD-10-CM | POA: Diagnosis not present

## 2020-08-26 DIAGNOSIS — D649 Anemia, unspecified: Secondary | ICD-10-CM | POA: Diagnosis not present

## 2020-08-26 DIAGNOSIS — G894 Chronic pain syndrome: Secondary | ICD-10-CM | POA: Diagnosis not present

## 2020-08-26 DIAGNOSIS — E875 Hyperkalemia: Secondary | ICD-10-CM | POA: Diagnosis not present

## 2020-08-26 DIAGNOSIS — E785 Hyperlipidemia, unspecified: Secondary | ICD-10-CM | POA: Diagnosis not present

## 2020-08-26 DIAGNOSIS — Z794 Long term (current) use of insulin: Secondary | ICD-10-CM | POA: Diagnosis not present

## 2020-08-26 DIAGNOSIS — F17210 Nicotine dependence, cigarettes, uncomplicated: Secondary | ICD-10-CM | POA: Diagnosis not present

## 2020-08-26 DIAGNOSIS — I10 Essential (primary) hypertension: Secondary | ICD-10-CM | POA: Diagnosis not present

## 2020-08-26 DIAGNOSIS — J449 Chronic obstructive pulmonary disease, unspecified: Secondary | ICD-10-CM | POA: Diagnosis not present

## 2020-08-27 DIAGNOSIS — F1120 Opioid dependence, uncomplicated: Secondary | ICD-10-CM | POA: Diagnosis not present

## 2020-09-10 DIAGNOSIS — F1120 Opioid dependence, uncomplicated: Secondary | ICD-10-CM | POA: Diagnosis not present

## 2020-09-29 DIAGNOSIS — F1120 Opioid dependence, uncomplicated: Secondary | ICD-10-CM | POA: Diagnosis not present

## 2020-10-12 DIAGNOSIS — E785 Hyperlipidemia, unspecified: Secondary | ICD-10-CM | POA: Diagnosis not present

## 2020-10-12 DIAGNOSIS — E1140 Type 2 diabetes mellitus with diabetic neuropathy, unspecified: Secondary | ICD-10-CM | POA: Diagnosis not present

## 2020-10-12 DIAGNOSIS — I10 Essential (primary) hypertension: Secondary | ICD-10-CM | POA: Diagnosis not present

## 2020-10-12 DIAGNOSIS — N289 Disorder of kidney and ureter, unspecified: Secondary | ICD-10-CM | POA: Diagnosis not present

## 2020-10-12 DIAGNOSIS — J449 Chronic obstructive pulmonary disease, unspecified: Secondary | ICD-10-CM | POA: Diagnosis not present

## 2020-10-12 DIAGNOSIS — E559 Vitamin D deficiency, unspecified: Secondary | ICD-10-CM | POA: Diagnosis not present

## 2020-10-12 DIAGNOSIS — G894 Chronic pain syndrome: Secondary | ICD-10-CM | POA: Diagnosis not present

## 2020-10-12 DIAGNOSIS — F418 Other specified anxiety disorders: Secondary | ICD-10-CM | POA: Diagnosis not present

## 2020-10-12 DIAGNOSIS — D649 Anemia, unspecified: Secondary | ICD-10-CM | POA: Diagnosis not present

## 2020-10-27 DIAGNOSIS — F1120 Opioid dependence, uncomplicated: Secondary | ICD-10-CM | POA: Diagnosis not present

## 2020-11-22 DIAGNOSIS — F1120 Opioid dependence, uncomplicated: Secondary | ICD-10-CM | POA: Diagnosis not present

## 2020-12-06 DIAGNOSIS — F1120 Opioid dependence, uncomplicated: Secondary | ICD-10-CM | POA: Diagnosis not present

## 2020-12-20 DIAGNOSIS — F1120 Opioid dependence, uncomplicated: Secondary | ICD-10-CM | POA: Diagnosis not present

## 2021-01-03 DIAGNOSIS — F1120 Opioid dependence, uncomplicated: Secondary | ICD-10-CM | POA: Diagnosis not present

## 2021-01-31 DIAGNOSIS — F1120 Opioid dependence, uncomplicated: Secondary | ICD-10-CM | POA: Diagnosis not present

## 2021-02-28 DIAGNOSIS — F1120 Opioid dependence, uncomplicated: Secondary | ICD-10-CM | POA: Diagnosis not present

## 2021-03-17 DIAGNOSIS — F418 Other specified anxiety disorders: Secondary | ICD-10-CM | POA: Diagnosis not present

## 2021-03-17 DIAGNOSIS — J449 Chronic obstructive pulmonary disease, unspecified: Secondary | ICD-10-CM | POA: Diagnosis not present

## 2021-03-17 DIAGNOSIS — E559 Vitamin D deficiency, unspecified: Secondary | ICD-10-CM | POA: Diagnosis not present

## 2021-03-17 DIAGNOSIS — I10 Essential (primary) hypertension: Secondary | ICD-10-CM | POA: Diagnosis not present

## 2021-03-17 DIAGNOSIS — E782 Mixed hyperlipidemia: Secondary | ICD-10-CM | POA: Diagnosis not present

## 2021-03-17 DIAGNOSIS — E1140 Type 2 diabetes mellitus with diabetic neuropathy, unspecified: Secondary | ICD-10-CM | POA: Diagnosis not present

## 2021-03-17 DIAGNOSIS — Z0001 Encounter for general adult medical examination with abnormal findings: Secondary | ICD-10-CM | POA: Diagnosis not present

## 2021-03-17 DIAGNOSIS — K219 Gastro-esophageal reflux disease without esophagitis: Secondary | ICD-10-CM | POA: Diagnosis not present

## 2021-03-17 DIAGNOSIS — E785 Hyperlipidemia, unspecified: Secondary | ICD-10-CM | POA: Diagnosis not present

## 2021-03-17 DIAGNOSIS — R3911 Hesitancy of micturition: Secondary | ICD-10-CM | POA: Diagnosis not present

## 2021-03-17 DIAGNOSIS — R110 Nausea: Secondary | ICD-10-CM | POA: Diagnosis not present

## 2021-03-17 DIAGNOSIS — R21 Rash and other nonspecific skin eruption: Secondary | ICD-10-CM | POA: Diagnosis not present

## 2021-03-28 DIAGNOSIS — F1120 Opioid dependence, uncomplicated: Secondary | ICD-10-CM | POA: Diagnosis not present

## 2021-04-02 DIAGNOSIS — E1165 Type 2 diabetes mellitus with hyperglycemia: Secondary | ICD-10-CM | POA: Diagnosis not present

## 2021-04-02 DIAGNOSIS — R0902 Hypoxemia: Secondary | ICD-10-CM | POA: Diagnosis not present

## 2021-04-02 DIAGNOSIS — J9601 Acute respiratory failure with hypoxia: Secondary | ICD-10-CM | POA: Diagnosis not present

## 2021-04-02 DIAGNOSIS — N179 Acute kidney failure, unspecified: Secondary | ICD-10-CM | POA: Diagnosis not present

## 2021-04-02 DIAGNOSIS — Z20822 Contact with and (suspected) exposure to covid-19: Secondary | ICD-10-CM | POA: Diagnosis not present

## 2021-04-02 DIAGNOSIS — G928 Other toxic encephalopathy: Secondary | ICD-10-CM | POA: Diagnosis not present

## 2021-04-02 DIAGNOSIS — R000 Tachycardia, unspecified: Secondary | ICD-10-CM | POA: Diagnosis not present

## 2021-04-02 DIAGNOSIS — I959 Hypotension, unspecified: Secondary | ICD-10-CM | POA: Diagnosis not present

## 2021-04-02 DIAGNOSIS — Z23 Encounter for immunization: Secondary | ICD-10-CM | POA: Diagnosis not present

## 2021-04-02 DIAGNOSIS — R4182 Altered mental status, unspecified: Secondary | ICD-10-CM | POA: Diagnosis not present

## 2021-04-02 DIAGNOSIS — R918 Other nonspecific abnormal finding of lung field: Secondary | ICD-10-CM | POA: Diagnosis not present

## 2021-04-02 DIAGNOSIS — I4510 Unspecified right bundle-branch block: Secondary | ICD-10-CM | POA: Diagnosis not present

## 2021-04-02 DIAGNOSIS — F17210 Nicotine dependence, cigarettes, uncomplicated: Secondary | ICD-10-CM | POA: Diagnosis not present

## 2021-04-02 DIAGNOSIS — R410 Disorientation, unspecified: Secondary | ICD-10-CM | POA: Diagnosis not present

## 2021-04-02 DIAGNOSIS — J9811 Atelectasis: Secondary | ICD-10-CM | POA: Diagnosis not present

## 2021-04-02 DIAGNOSIS — J189 Pneumonia, unspecified organism: Secondary | ICD-10-CM | POA: Diagnosis not present

## 2021-04-03 DIAGNOSIS — R9431 Abnormal electrocardiogram [ECG] [EKG]: Secondary | ICD-10-CM | POA: Diagnosis not present

## 2021-04-03 DIAGNOSIS — R4182 Altered mental status, unspecified: Secondary | ICD-10-CM | POA: Diagnosis not present

## 2021-04-03 DIAGNOSIS — N179 Acute kidney failure, unspecified: Secondary | ICD-10-CM | POA: Diagnosis not present

## 2021-04-03 DIAGNOSIS — J9601 Acute respiratory failure with hypoxia: Secondary | ICD-10-CM | POA: Diagnosis not present

## 2021-04-03 DIAGNOSIS — J189 Pneumonia, unspecified organism: Secondary | ICD-10-CM | POA: Diagnosis not present

## 2021-04-03 DIAGNOSIS — G929 Unspecified toxic encephalopathy: Secondary | ICD-10-CM | POA: Diagnosis not present

## 2021-04-04 DIAGNOSIS — J9601 Acute respiratory failure with hypoxia: Secondary | ICD-10-CM | POA: Diagnosis not present

## 2021-04-04 DIAGNOSIS — J189 Pneumonia, unspecified organism: Secondary | ICD-10-CM | POA: Diagnosis not present

## 2021-04-04 DIAGNOSIS — N179 Acute kidney failure, unspecified: Secondary | ICD-10-CM | POA: Diagnosis not present

## 2021-04-04 DIAGNOSIS — G929 Unspecified toxic encephalopathy: Secondary | ICD-10-CM | POA: Diagnosis not present

## 2021-04-06 DIAGNOSIS — Z794 Long term (current) use of insulin: Secondary | ICD-10-CM | POA: Diagnosis not present

## 2021-04-06 DIAGNOSIS — J449 Chronic obstructive pulmonary disease, unspecified: Secondary | ICD-10-CM | POA: Diagnosis not present

## 2021-04-06 DIAGNOSIS — N179 Acute kidney failure, unspecified: Secondary | ICD-10-CM | POA: Diagnosis not present

## 2021-04-06 DIAGNOSIS — E119 Type 2 diabetes mellitus without complications: Secondary | ICD-10-CM | POA: Diagnosis not present

## 2021-04-06 DIAGNOSIS — J189 Pneumonia, unspecified organism: Secondary | ICD-10-CM | POA: Diagnosis not present

## 2021-04-06 DIAGNOSIS — Z7984 Long term (current) use of oral hypoglycemic drugs: Secondary | ICD-10-CM | POA: Diagnosis not present

## 2021-04-07 DIAGNOSIS — E119 Type 2 diabetes mellitus without complications: Secondary | ICD-10-CM | POA: Diagnosis not present

## 2021-04-07 DIAGNOSIS — J189 Pneumonia, unspecified organism: Secondary | ICD-10-CM | POA: Diagnosis not present

## 2021-04-07 DIAGNOSIS — N179 Acute kidney failure, unspecified: Secondary | ICD-10-CM | POA: Diagnosis not present

## 2021-04-07 DIAGNOSIS — J449 Chronic obstructive pulmonary disease, unspecified: Secondary | ICD-10-CM | POA: Diagnosis not present

## 2021-04-20 DIAGNOSIS — F418 Other specified anxiety disorders: Secondary | ICD-10-CM | POA: Diagnosis not present

## 2021-04-20 DIAGNOSIS — E782 Mixed hyperlipidemia: Secondary | ICD-10-CM | POA: Diagnosis not present

## 2021-04-20 DIAGNOSIS — Z0001 Encounter for general adult medical examination with abnormal findings: Secondary | ICD-10-CM | POA: Diagnosis not present

## 2021-04-20 DIAGNOSIS — E1140 Type 2 diabetes mellitus with diabetic neuropathy, unspecified: Secondary | ICD-10-CM | POA: Diagnosis not present

## 2021-04-20 DIAGNOSIS — J449 Chronic obstructive pulmonary disease, unspecified: Secondary | ICD-10-CM | POA: Diagnosis not present

## 2021-04-20 DIAGNOSIS — K219 Gastro-esophageal reflux disease without esophagitis: Secondary | ICD-10-CM | POA: Diagnosis not present

## 2021-04-20 DIAGNOSIS — N1832 Chronic kidney disease, stage 3b: Secondary | ICD-10-CM | POA: Diagnosis not present

## 2021-04-20 DIAGNOSIS — I10 Essential (primary) hypertension: Secondary | ICD-10-CM | POA: Diagnosis not present

## 2021-04-25 DIAGNOSIS — F1120 Opioid dependence, uncomplicated: Secondary | ICD-10-CM | POA: Diagnosis not present

## 2021-04-27 ENCOUNTER — Other Ambulatory Visit: Payer: Self-pay | Admitting: Internal Medicine

## 2021-04-27 DIAGNOSIS — Z1231 Encounter for screening mammogram for malignant neoplasm of breast: Secondary | ICD-10-CM

## 2021-05-09 DIAGNOSIS — F1120 Opioid dependence, uncomplicated: Secondary | ICD-10-CM | POA: Diagnosis not present

## 2021-05-25 ENCOUNTER — Encounter: Payer: Self-pay | Admitting: Internal Medicine

## 2021-05-25 DIAGNOSIS — G479 Sleep disorder, unspecified: Secondary | ICD-10-CM | POA: Diagnosis not present

## 2021-05-25 DIAGNOSIS — S62645A Nondisplaced fracture of proximal phalanx of left ring finger, initial encounter for closed fracture: Secondary | ICD-10-CM | POA: Diagnosis not present

## 2021-05-25 DIAGNOSIS — S4992XA Unspecified injury of left shoulder and upper arm, initial encounter: Secondary | ICD-10-CM | POA: Diagnosis not present

## 2021-05-25 DIAGNOSIS — S62613A Displaced fracture of proximal phalanx of left middle finger, initial encounter for closed fracture: Secondary | ICD-10-CM | POA: Diagnosis not present

## 2021-05-25 DIAGNOSIS — S62609A Fracture of unspecified phalanx of unspecified finger, initial encounter for closed fracture: Secondary | ICD-10-CM | POA: Diagnosis not present

## 2021-05-25 DIAGNOSIS — S0990XA Unspecified injury of head, initial encounter: Secondary | ICD-10-CM | POA: Diagnosis not present

## 2021-05-25 DIAGNOSIS — W010XXA Fall on same level from slipping, tripping and stumbling without subsequent striking against object, initial encounter: Secondary | ICD-10-CM | POA: Diagnosis not present

## 2021-05-25 DIAGNOSIS — J449 Chronic obstructive pulmonary disease, unspecified: Secondary | ICD-10-CM | POA: Diagnosis not present

## 2021-05-25 DIAGNOSIS — R0602 Shortness of breath: Secondary | ICD-10-CM | POA: Diagnosis not present

## 2021-05-25 DIAGNOSIS — S51032A Puncture wound without foreign body of left elbow, initial encounter: Secondary | ICD-10-CM | POA: Diagnosis not present

## 2021-05-25 DIAGNOSIS — Y999 Unspecified external cause status: Secondary | ICD-10-CM | POA: Diagnosis not present

## 2021-05-25 DIAGNOSIS — F1910 Other psychoactive substance abuse, uncomplicated: Secondary | ICD-10-CM | POA: Diagnosis not present

## 2021-05-25 DIAGNOSIS — E782 Mixed hyperlipidemia: Secondary | ICD-10-CM | POA: Diagnosis not present

## 2021-05-25 DIAGNOSIS — D72829 Elevated white blood cell count, unspecified: Secondary | ICD-10-CM | POA: Diagnosis not present

## 2021-05-25 DIAGNOSIS — R2232 Localized swelling, mass and lump, left upper limb: Secondary | ICD-10-CM | POA: Diagnosis not present

## 2021-05-25 DIAGNOSIS — E11621 Type 2 diabetes mellitus with foot ulcer: Secondary | ICD-10-CM | POA: Diagnosis not present

## 2021-05-25 DIAGNOSIS — M542 Cervicalgia: Secondary | ICD-10-CM | POA: Diagnosis not present

## 2021-05-25 DIAGNOSIS — M25522 Pain in left elbow: Secondary | ICD-10-CM | POA: Diagnosis not present

## 2021-05-25 DIAGNOSIS — I10 Essential (primary) hypertension: Secondary | ICD-10-CM | POA: Diagnosis not present

## 2021-05-25 DIAGNOSIS — E1140 Type 2 diabetes mellitus with diabetic neuropathy, unspecified: Secondary | ICD-10-CM | POA: Diagnosis not present

## 2021-05-25 DIAGNOSIS — W19XXXA Unspecified fall, initial encounter: Secondary | ICD-10-CM | POA: Diagnosis not present

## 2021-05-25 DIAGNOSIS — S62615A Displaced fracture of proximal phalanx of left ring finger, initial encounter for closed fracture: Secondary | ICD-10-CM | POA: Diagnosis not present

## 2021-05-25 DIAGNOSIS — E871 Hypo-osmolality and hyponatremia: Secondary | ICD-10-CM | POA: Diagnosis not present

## 2021-05-25 DIAGNOSIS — M25532 Pain in left wrist: Secondary | ICD-10-CM | POA: Diagnosis not present

## 2021-05-25 DIAGNOSIS — S62643A Nondisplaced fracture of proximal phalanx of left middle finger, initial encounter for closed fracture: Secondary | ICD-10-CM | POA: Diagnosis not present

## 2021-05-25 DIAGNOSIS — K219 Gastro-esophageal reflux disease without esophagitis: Secondary | ICD-10-CM | POA: Diagnosis not present

## 2021-05-25 DIAGNOSIS — S62617A Displaced fracture of proximal phalanx of left little finger, initial encounter for closed fracture: Secondary | ICD-10-CM | POA: Diagnosis not present

## 2021-05-25 DIAGNOSIS — E1165 Type 2 diabetes mellitus with hyperglycemia: Secondary | ICD-10-CM | POA: Diagnosis not present

## 2021-05-25 DIAGNOSIS — R519 Headache, unspecified: Secondary | ICD-10-CM | POA: Diagnosis not present

## 2021-05-25 DIAGNOSIS — N1832 Chronic kidney disease, stage 3b: Secondary | ICD-10-CM | POA: Diagnosis not present

## 2021-05-25 DIAGNOSIS — L97411 Non-pressure chronic ulcer of right heel and midfoot limited to breakdown of skin: Secondary | ICD-10-CM | POA: Diagnosis not present

## 2021-05-31 DIAGNOSIS — S62609A Fracture of unspecified phalanx of unspecified finger, initial encounter for closed fracture: Secondary | ICD-10-CM | POA: Diagnosis not present

## 2021-06-06 DIAGNOSIS — J449 Chronic obstructive pulmonary disease, unspecified: Secondary | ICD-10-CM | POA: Diagnosis not present

## 2021-06-06 DIAGNOSIS — R21 Rash and other nonspecific skin eruption: Secondary | ICD-10-CM | POA: Diagnosis not present

## 2021-06-06 DIAGNOSIS — K219 Gastro-esophageal reflux disease without esophagitis: Secondary | ICD-10-CM | POA: Diagnosis not present

## 2021-06-06 DIAGNOSIS — E1140 Type 2 diabetes mellitus with diabetic neuropathy, unspecified: Secondary | ICD-10-CM | POA: Diagnosis not present

## 2021-06-06 DIAGNOSIS — N1832 Chronic kidney disease, stage 3b: Secondary | ICD-10-CM | POA: Diagnosis not present

## 2021-06-06 DIAGNOSIS — E782 Mixed hyperlipidemia: Secondary | ICD-10-CM | POA: Diagnosis not present

## 2021-06-06 DIAGNOSIS — I10 Essential (primary) hypertension: Secondary | ICD-10-CM | POA: Diagnosis not present

## 2021-06-06 DIAGNOSIS — E559 Vitamin D deficiency, unspecified: Secondary | ICD-10-CM | POA: Diagnosis not present

## 2021-06-09 DIAGNOSIS — S62609D Fracture of unspecified phalanx of unspecified finger, subsequent encounter for fracture with routine healing: Secondary | ICD-10-CM | POA: Diagnosis not present

## 2021-06-09 DIAGNOSIS — S62603A Fracture of unspecified phalanx of left middle finger, initial encounter for closed fracture: Secondary | ICD-10-CM | POA: Diagnosis not present

## 2021-06-14 DIAGNOSIS — L299 Pruritus, unspecified: Secondary | ICD-10-CM | POA: Diagnosis not present

## 2021-06-14 DIAGNOSIS — L853 Xerosis cutis: Secondary | ICD-10-CM | POA: Diagnosis not present

## 2021-06-14 DIAGNOSIS — L400 Psoriasis vulgaris: Secondary | ICD-10-CM | POA: Diagnosis not present

## 2021-06-20 DIAGNOSIS — F1120 Opioid dependence, uncomplicated: Secondary | ICD-10-CM | POA: Diagnosis not present

## 2021-07-08 DIAGNOSIS — F1120 Opioid dependence, uncomplicated: Secondary | ICD-10-CM | POA: Diagnosis not present

## 2021-07-11 ENCOUNTER — Ambulatory Visit (HOSPITAL_BASED_OUTPATIENT_CLINIC_OR_DEPARTMENT_OTHER): Payer: Medicare HMO

## 2021-07-13 DIAGNOSIS — H5203 Hypermetropia, bilateral: Secondary | ICD-10-CM | POA: Diagnosis not present

## 2021-08-05 DIAGNOSIS — F1120 Opioid dependence, uncomplicated: Secondary | ICD-10-CM | POA: Diagnosis not present

## 2021-08-08 DIAGNOSIS — H40013 Open angle with borderline findings, low risk, bilateral: Secondary | ICD-10-CM | POA: Diagnosis not present

## 2021-08-08 DIAGNOSIS — E119 Type 2 diabetes mellitus without complications: Secondary | ICD-10-CM | POA: Diagnosis not present

## 2021-08-08 DIAGNOSIS — H25813 Combined forms of age-related cataract, bilateral: Secondary | ICD-10-CM | POA: Diagnosis not present

## 2021-08-17 DIAGNOSIS — Z0100 Encounter for examination of eyes and vision without abnormal findings: Secondary | ICD-10-CM | POA: Diagnosis not present

## 2021-08-24 DIAGNOSIS — L400 Psoriasis vulgaris: Secondary | ICD-10-CM | POA: Diagnosis not present

## 2021-08-24 DIAGNOSIS — L299 Pruritus, unspecified: Secondary | ICD-10-CM | POA: Diagnosis not present

## 2021-08-24 DIAGNOSIS — L209 Atopic dermatitis, unspecified: Secondary | ICD-10-CM | POA: Diagnosis not present

## 2021-08-24 DIAGNOSIS — L301 Dyshidrosis [pompholyx]: Secondary | ICD-10-CM | POA: Diagnosis not present

## 2021-09-01 DIAGNOSIS — Z1159 Encounter for screening for other viral diseases: Secondary | ICD-10-CM | POA: Diagnosis not present

## 2021-09-01 DIAGNOSIS — M545 Low back pain, unspecified: Secondary | ICD-10-CM | POA: Diagnosis not present

## 2021-09-01 DIAGNOSIS — R5383 Other fatigue: Secondary | ICD-10-CM | POA: Diagnosis not present

## 2021-09-01 DIAGNOSIS — E559 Vitamin D deficiency, unspecified: Secondary | ICD-10-CM | POA: Diagnosis not present

## 2021-09-01 DIAGNOSIS — E1165 Type 2 diabetes mellitus with hyperglycemia: Secondary | ICD-10-CM | POA: Diagnosis not present

## 2021-09-02 DIAGNOSIS — F1120 Opioid dependence, uncomplicated: Secondary | ICD-10-CM | POA: Diagnosis not present

## 2021-09-28 DIAGNOSIS — R0602 Shortness of breath: Secondary | ICD-10-CM | POA: Diagnosis not present

## 2021-09-28 DIAGNOSIS — E7800 Pure hypercholesterolemia, unspecified: Secondary | ICD-10-CM | POA: Diagnosis not present

## 2021-09-28 DIAGNOSIS — E538 Deficiency of other specified B group vitamins: Secondary | ICD-10-CM | POA: Diagnosis not present

## 2021-09-28 DIAGNOSIS — D72829 Elevated white blood cell count, unspecified: Secondary | ICD-10-CM | POA: Diagnosis not present

## 2021-09-28 DIAGNOSIS — R300 Dysuria: Secondary | ICD-10-CM | POA: Diagnosis not present

## 2021-09-28 DIAGNOSIS — E559 Vitamin D deficiency, unspecified: Secondary | ICD-10-CM | POA: Diagnosis not present

## 2021-09-28 DIAGNOSIS — Z780 Asymptomatic menopausal state: Secondary | ICD-10-CM | POA: Diagnosis not present

## 2021-09-28 DIAGNOSIS — E1165 Type 2 diabetes mellitus with hyperglycemia: Secondary | ICD-10-CM | POA: Diagnosis not present

## 2021-09-28 DIAGNOSIS — Z0000 Encounter for general adult medical examination without abnormal findings: Secondary | ICD-10-CM | POA: Diagnosis not present

## 2021-09-28 DIAGNOSIS — R7989 Other specified abnormal findings of blood chemistry: Secondary | ICD-10-CM | POA: Diagnosis not present

## 2021-10-03 DIAGNOSIS — F1120 Opioid dependence, uncomplicated: Secondary | ICD-10-CM | POA: Diagnosis not present

## 2021-10-18 DIAGNOSIS — R319 Hematuria, unspecified: Secondary | ICD-10-CM | POA: Diagnosis not present

## 2021-10-28 DIAGNOSIS — E1165 Type 2 diabetes mellitus with hyperglycemia: Secondary | ICD-10-CM | POA: Diagnosis not present

## 2021-10-28 DIAGNOSIS — E559 Vitamin D deficiency, unspecified: Secondary | ICD-10-CM | POA: Diagnosis not present

## 2021-10-28 DIAGNOSIS — F419 Anxiety disorder, unspecified: Secondary | ICD-10-CM | POA: Diagnosis not present

## 2021-10-28 DIAGNOSIS — Z6821 Body mass index (BMI) 21.0-21.9, adult: Secondary | ICD-10-CM | POA: Diagnosis not present

## 2021-11-02 DIAGNOSIS — F1120 Opioid dependence, uncomplicated: Secondary | ICD-10-CM | POA: Diagnosis not present

## 2024-02-12 DEATH — deceased
